# Patient Record
Sex: Male | Born: 1975 | Race: Black or African American | Hispanic: No | Marital: Married | Smoking: Never smoker
Health system: Southern US, Community
[De-identification: ages and names within clinical notes are randomized; demographics above are authoritative.]

## PROBLEM LIST (undated history)

## (undated) DIAGNOSIS — E041 Nontoxic single thyroid nodule: Secondary | ICD-10-CM

## (undated) DIAGNOSIS — IMO0001 Reserved for inherently not codable concepts without codable children: Secondary | ICD-10-CM

## (undated) DIAGNOSIS — E119 Type 2 diabetes mellitus without complications: Secondary | ICD-10-CM

## (undated) DIAGNOSIS — E049 Nontoxic goiter, unspecified: Secondary | ICD-10-CM

## (undated) DIAGNOSIS — G4730 Sleep apnea, unspecified: Secondary | ICD-10-CM

## (undated) DIAGNOSIS — M1990 Unspecified osteoarthritis, unspecified site: Secondary | ICD-10-CM

## (undated) HISTORY — DX: Nontoxic single thyroid nodule: E04.1

## (undated) HISTORY — PX: WISDOM TOOTH EXTRACTION: SHX21

## (undated) HISTORY — DX: Sleep apnea, unspecified: G47.30

## (undated) HISTORY — DX: Type 2 diabetes mellitus without complications: E11.9

## (undated) HISTORY — DX: Nontoxic goiter, unspecified: E04.9

---

## 2004-06-21 HISTORY — PX: VASECTOMY: SHX75

## 2014-10-11 ENCOUNTER — Other Ambulatory Visit: Payer: Self-pay | Admitting: Internal Medicine

## 2014-10-11 ENCOUNTER — Ambulatory Visit
Admission: RE | Admit: 2014-10-11 | Discharge: 2014-10-11 | Disposition: A | Discharge status: Home / Self Care | Payer: Self-pay | Source: Ambulatory Visit | Attending: Internal Medicine | Admitting: Internal Medicine

## 2014-10-11 DIAGNOSIS — R0600 Dyspnea, unspecified: Secondary | ICD-10-CM

## 2014-10-18 ENCOUNTER — Other Ambulatory Visit: Payer: Self-pay | Admitting: Internal Medicine

## 2014-10-18 DIAGNOSIS — R9389 Abnormal findings on diagnostic imaging of other specified body structures: Secondary | ICD-10-CM

## 2014-10-22 ENCOUNTER — Inpatient Hospital Stay: Admission: RE | Admit: 2014-10-22 | Payer: BC Managed Care – PPO | Source: Ambulatory Visit

## 2014-10-22 ENCOUNTER — Telehealth: Payer: Self-pay | Admitting: Internal Medicine

## 2014-10-22 NOTE — Telephone Encounter (Signed)
Received records from [facility] for appointment with Dr Debara Pickett on 11/22/14.  Records given to [facility] (medical records) for Dr Lysbeth Penner schedule on 11/22/14. lp

## 2014-10-25 ENCOUNTER — Ambulatory Visit
Admission: RE | Admit: 2014-10-25 | Discharge: 2014-10-25 | Disposition: A | Discharge status: Home / Self Care | Payer: BC Managed Care – PPO | Source: Ambulatory Visit | Attending: Internal Medicine | Admitting: Internal Medicine

## 2014-10-25 ENCOUNTER — Other Ambulatory Visit: Payer: BC Managed Care – PPO

## 2014-10-25 DIAGNOSIS — R9389 Abnormal findings on diagnostic imaging of other specified body structures: Secondary | ICD-10-CM

## 2014-10-25 MED ORDER — IOPAMIDOL (ISOVUE-300) INJECTION 61%
75.0000 mL | Freq: Once | INTRAVENOUS | Status: AC | PRN
  Administered 2014-10-25: 75 mL via INTRAVENOUS

## 2014-10-31 ENCOUNTER — Other Ambulatory Visit: Payer: Self-pay | Admitting: Internal Medicine

## 2014-10-31 DIAGNOSIS — R9389 Abnormal findings on diagnostic imaging of other specified body structures: Secondary | ICD-10-CM

## 2014-10-31 DIAGNOSIS — E049 Nontoxic goiter, unspecified: Secondary | ICD-10-CM

## 2014-11-01 ENCOUNTER — Ambulatory Visit
Admission: RE | Admit: 2014-11-01 | Discharge: 2014-11-01 | Disposition: A | Discharge status: Home / Self Care | Payer: BC Managed Care – PPO | Source: Ambulatory Visit | Attending: Internal Medicine | Admitting: Internal Medicine

## 2014-11-01 DIAGNOSIS — R9389 Abnormal findings on diagnostic imaging of other specified body structures: Secondary | ICD-10-CM

## 2014-11-01 DIAGNOSIS — E049 Nontoxic goiter, unspecified: Secondary | ICD-10-CM

## 2014-11-11 ENCOUNTER — Other Ambulatory Visit: Payer: Self-pay | Admitting: Internal Medicine

## 2014-11-12 ENCOUNTER — Other Ambulatory Visit: Payer: Self-pay | Admitting: Internal Medicine

## 2014-11-12 DIAGNOSIS — E041 Nontoxic single thyroid nodule: Secondary | ICD-10-CM

## 2014-11-19 ENCOUNTER — Other Ambulatory Visit: Payer: Self-pay | Admitting: Internal Medicine

## 2014-11-19 DIAGNOSIS — E041 Nontoxic single thyroid nodule: Secondary | ICD-10-CM

## 2014-11-20 ENCOUNTER — Ambulatory Visit
Admission: RE | Admit: 2014-11-20 | Discharge: 2014-11-20 | Disposition: A | Discharge status: Home / Self Care | Payer: BC Managed Care – PPO | Source: Ambulatory Visit | Attending: Internal Medicine | Admitting: Internal Medicine

## 2014-11-20 ENCOUNTER — Other Ambulatory Visit (HOSPITAL_COMMUNITY)
Admission: RE | Admit: 2014-11-20 | Discharge: 2014-11-20 | Disposition: A | Discharge status: Home / Self Care | Payer: BC Managed Care – PPO | Source: Ambulatory Visit | Attending: Interventional Radiology | Admitting: Interventional Radiology

## 2014-11-20 DIAGNOSIS — E041 Nontoxic single thyroid nodule: Secondary | ICD-10-CM

## 2014-11-22 ENCOUNTER — Ambulatory Visit (INDEPENDENT_AMBULATORY_CARE_PROVIDER_SITE_OTHER): Payer: BC Managed Care – PPO | Admitting: Internal Medicine

## 2014-11-22 ENCOUNTER — Encounter: Payer: Self-pay | Admitting: Internal Medicine

## 2014-11-22 VITALS — BP 112/88 | HR 68 | Ht 73.0 in | Wt 291.8 lb

## 2014-11-22 DIAGNOSIS — D497 Neoplasm of unspecified behavior of endocrine glands and other parts of nervous system: Secondary | ICD-10-CM

## 2014-11-22 DIAGNOSIS — R0609 Other forms of dyspnea: Secondary | ICD-10-CM | POA: Diagnosis not present

## 2014-11-22 DIAGNOSIS — R0602 Shortness of breath: Secondary | ICD-10-CM

## 2014-11-22 DIAGNOSIS — E0789 Other specified disorders of thyroid: Secondary | ICD-10-CM | POA: Insufficient documentation

## 2014-11-22 DIAGNOSIS — E119 Type 2 diabetes mellitus without complications: Secondary | ICD-10-CM | POA: Insufficient documentation

## 2014-11-22 DIAGNOSIS — Z9989 Dependence on other enabling machines and devices: Secondary | ICD-10-CM

## 2014-11-22 DIAGNOSIS — G4733 Obstructive sleep apnea (adult) (pediatric): Secondary | ICD-10-CM

## 2014-11-22 NOTE — Patient Instructions (Signed)
Dr Debara Pickett has ordered a cardiometabolic test - this is done @ [facility].  Please schedule a follow up visit with Dr. Debara Pickett after your test.   What is a Cardiopulmonary Exercise Test (CPET)?   The Cardiopulmonary Exercise Test is a highly sensitive, non-invasive stress test. It is considered a stress test because the exercise stresses your body's systems by making them work faster and harder. A disease or condition that affects the heart, lungs or muscles will limit how much faster and harder these systems can work. A CPET assesses how well the heart, lungs, and muscles are working individually, and how these systems are working in unison. Your heart and lungs work together to deliver oxygen to your muscles, where it is used to make energy, and to remove carbon dioxide from your body.  The full cardiopulmonary system is assessed during a CPET by measuring the amount of oxygen your body is using, the amount of carbon dioxide it is producing, your breathing pattern, and electrocardiogram (EKG) while you are riding a stationary bicycle.  The traditional treadmill stress test only relies on the EKG, which only partially assesses the heart and nothing else. Besides detecting problems in multiple body systems, the CPET is also used to monitor changes in your disease condition, the effect of certain medications on your body, and if medical therapy is improving your condition.  What conditions can be detected/monitored by the Cardiopulmonary ExerciseTest?  Heart, lung, and metabolic conditions may cause shortness of breath, exercise intolerance or discomfort and pain in the chest. The CPET is the only test that can simultaneously determine which of these systems is causing the problem

## 2014-11-22 NOTE — Progress Notes (Signed)
OFFICE NOTE  Chief Complaint:  DOE  Primary Care Physician: No primary care provider on file.  HPI:  Tommy Rodriguez is a pleasant 39 year old male who is coming referred to me for evaluation of shortness of breath. Recently he's had some shortness of breath with activities particularly at the end of exercise where he has some very heavy breathing. He's also noted to have some wheezing. He does have a history of diabetes, sleep apnea and recently was found to have a thyroid mass. That was recently biopsied and the results are pending. There apparently is some tracheal compression from the mass as seen on imaging. He does wear CPAP at night which seems to help his breathing and is less wheezy. His shortness of breath does tend occur with exertion but he is able to do cycling and exercise without significant difficulty. He denies any chest pain. He does do a lot of physical activity including weight lifting and some aerobic activity. He generally eats a healthy diet.  PMHx:  Past Medical History  Diagnosis Date  . Sleep apnea   . Type 2 diabetes mellitus     Past Surgical History  Procedure Laterality Date  . Wisdom tooth extraction  childhood  . Vasectomy  2006    FAMHx:  Family History  Problem Relation Age of Onset  . Hypertension Father   . Diabetes Father   . Glaucoma Mother   . Kidney failure Mother     s/p transplant  . Stroke Mother   . Hypertension Mother   . Heart failure Mother   . Hypertension Sister   . Diabetes Sister     SOCHx:   reports that he has never smoked. He does not have any smokeless tobacco history on file. His alcohol and drug histories are not on file.  ALLERGIES:  No Known Allergies  ROS: A comprehensive review of systems was negative except for: Ears, nose, mouth, throat, and face: positive for Neck mass Respiratory: positive for dyspnea on exertion  HOME MEDS: Current Outpatient Prescriptions  Medication Sig Dispense Refill  .  glimepiride (AMARYL) 4 MG tablet Take 1 tablet by mouth daily. Take 1 tab daily  11  . metFORMIN (GLUCOPHAGE) 500 MG tablet Take 500 mg by mouth 2 (two) times daily with a meal.    . sitaGLIPtin (JANUVIA) 100 MG tablet Take 100 mg by mouth daily.     No current facility-administered medications for this visit.    LABS/IMAGING: No results found for this or any previous visit (from the past 48 hour(s)). US Thyroid Biopsy  11/21/2014   CLINICAL DATA:  39 year old male  EXAM: ULTRASOUND GUIDED NEEDLE ASPIRATE BIOPSY OF THE THYROID GLAND  COMPARISON:  None.  PROCEDURE: The procedure, risks, benefits, and alternatives were explained to the patient. Questions regarding the procedure were encouraged and answered. The patient understands and consents to the procedure.  Ultrasound survey was performed with images stored and sent to PACs.  The left neck was prepped with Betadine in a sterile fashion, and a sterile drape was applied covering the operative field. A sterile gown and sterile gloves were used for the procedure. Local anesthesia was provided with 1% Lidocaine.  Ultrasound guidance was used to infiltrate the region with 1% lidocaine for local anesthesia. Four separate 25 gauge fine needle biopsy were then acquired of the left upper thyroid, lateral lesion with ultrasound guidance. Images were stored.  Slide preparation was performed.  Final image was stored after biopsy.  Patient tolerated  the procedure well and remained hemodynamically stable throughout.  No complications were encountered and no significant blood loss was encounter  COMPLICATIONS: None.  FINDINGS: Ultrasound survey demonstrates heterogeneous left thyroid lobe. A nodular region in the superior aspect of the left thyroid lobe laterally was targeted for biopsy. This corresponds to the area which was identified on ultrasound survey 11/01/2014.  Images during the case demonstrate safe placement of the needle tip into this laterally position  nodule.  Final image demonstrates no complicating features.  IMPRESSION: Status post ultrasound-guided biopsy of left upper lobe thyroid nodule laterally. Tissue specimen sent to pathology for complete histopathologic analysis.  Ultrasound survey of the left thyroid lobe demonstrates enlarged and heterogeneous thyroid tissue, with no other focal nodule identified on the current study which would be amenable for biopsy.  Signed,  Dulcy Fanny. Earleen Newport, DO  [facility]  [facility]   Electronically Signed   By: Corrie Mckusick D.O.   On: 11/21/2014 08:08   US Thyroid Biopsy  11/21/2014   CLINICAL DATA:  39 year old male  EXAM: ULTRASOUND GUIDED NEEDLE ASPIRATE BIOPSY OF THE THYROID GLAND  COMPARISON:  None.  PROCEDURE: The procedure, risks, benefits, and alternatives were explained to the patient. Questions regarding the procedure were encouraged and answered. The patient understands and consents to the procedure.  Ultrasound survey was performed with images stored and sent to PACs.  The left neck was prepped with Betadine in a sterile fashion, and a sterile drape was applied covering the operative field. A sterile gown and sterile gloves were used for the procedure. Local anesthesia was provided with 1% Lidocaine.  Ultrasound guidance was used to infiltrate the region with 1% lidocaine for local anesthesia. Four separate 25 gauge fine needle biopsy were then acquired of the left upper thyroid, lateral lesion with ultrasound guidance. Images were stored.  Slide preparation was performed.  Final image was stored after biopsy.  Patient tolerated the procedure well and remained hemodynamically stable throughout.  No complications were encountered and no significant blood loss was encounter  COMPLICATIONS: None.  FINDINGS: Ultrasound survey demonstrates heterogeneous left thyroid lobe. A nodular region in the superior aspect of the left thyroid lobe laterally was targeted for  biopsy. This corresponds to the area which was identified on ultrasound survey 11/01/2014.  Images during the case demonstrate safe placement of the needle tip into this laterally position nodule.  Final image demonstrates no complicating features.  IMPRESSION: Status post ultrasound-guided biopsy of left upper lobe thyroid nodule laterally. Tissue specimen sent to pathology for complete histopathologic analysis.  Ultrasound survey of the left thyroid lobe demonstrates enlarged and heterogeneous thyroid tissue, with no other focal nodule identified on the current study which would be amenable for biopsy.  Signed,  Dulcy Fanny. Earleen Newport, DO  [facility]  [facility]   Electronically Signed   By: Corrie Mckusick D.O.   On: 11/21/2014 08:08    WEIGHTS: Wt Readings from Last 3 Encounters:  11/22/14 291 lb 12.8 oz (132.36 kg)    VITALS: BP 112/88 mmHg  Pulse 68  Ht 6\' 1"  (1.854 m)  Wt 291 lb 12.8 oz (132.36 kg)  BMI 38.51 kg/m2  EXAM: General appearance: alert and no distress Neck: no carotid bruit and no JVD Lungs: clear to auscultation bilaterally Heart: regular rate and rhythm, S1, S2 normal, no murmur, click, rub or gallop Abdomen: soft, non-tender; bowel sounds normal; no masses,  no organomegaly Extremities: extremities normal, atraumatic, no cyanosis  or edema Pulses: 2+ and symmetric Skin: Skin color, texture, turgor normal. No rashes or lesions Neurologic: Grossly normal Psych: Pleasant  EKG: Normal sinus rhythm at 68  ASSESSMENT: 1. Dyspnea on exertion 2. Type 2 diabetes 3. Obstructive sleep apnea on CPAP 4. Thyroid mass  PLAN: 1.   Mr. Mcdonagh is describing some shortness of breath and exercise. His EKG is normal at rest. He does have risk factors for heart disease including diabetes, however this is been well controlled only for a few years. Family history significant for heart disease in his mother however she was a type I diabetic.  Given his young age, unlikely that he has any significant coronary disease. Nonetheless, I would recommend an exercise stress test. In fact, cardio metabolic exercise testing on a bicycle would be the most helpful. Would also allow [place] to get some pulmonary function tests to rule out any upper airway obstruction related to his thyroid mass. In addition will monitor EKG and rule out hopefully ischemia.  Plan to see him back to discuss results of the stress test in a few weeks. Thanks again for the kind referral.  Pixie Casino, MD, [facility] Attending Cardiologist [place] 11/22/2014, 11:34 AM

## 2014-12-06 ENCOUNTER — Ambulatory Visit (HOSPITAL_COMMUNITY): Payer: BC Managed Care – PPO | Attending: Internal Medicine

## 2014-12-06 DIAGNOSIS — R0602 Shortness of breath: Secondary | ICD-10-CM

## 2014-12-06 DIAGNOSIS — R0600 Dyspnea, unspecified: Secondary | ICD-10-CM | POA: Insufficient documentation

## 2014-12-31 ENCOUNTER — Ambulatory Visit: Payer: BC Managed Care – PPO | Admitting: Internal Medicine

## 2014-12-31 ENCOUNTER — Telehealth: Payer: Self-pay | Admitting: Internal Medicine

## 2014-12-31 NOTE — Telephone Encounter (Signed)
Returning your call. °

## 2014-12-31 NOTE — Telephone Encounter (Signed)
Spoke with patient regarding CPX results. He will follow up with Dr. Debara Pickett 7/13 0900

## 2015-01-01 ENCOUNTER — Encounter: Payer: Self-pay | Admitting: Internal Medicine

## 2015-01-01 ENCOUNTER — Ambulatory Visit (INDEPENDENT_AMBULATORY_CARE_PROVIDER_SITE_OTHER): Payer: BC Managed Care – PPO | Admitting: Internal Medicine

## 2015-01-01 VITALS — BP 120/80 | HR 78 | Ht 73.0 in | Wt 292.0 lb

## 2015-01-01 DIAGNOSIS — G4733 Obstructive sleep apnea (adult) (pediatric): Secondary | ICD-10-CM | POA: Diagnosis not present

## 2015-01-01 DIAGNOSIS — E119 Type 2 diabetes mellitus without complications: Secondary | ICD-10-CM | POA: Diagnosis not present

## 2015-01-01 DIAGNOSIS — R0609 Other forms of dyspnea: Secondary | ICD-10-CM | POA: Diagnosis not present

## 2015-01-01 DIAGNOSIS — Z9989 Dependence on other enabling machines and devices: Principal | ICD-10-CM

## 2015-01-01 NOTE — Patient Instructions (Signed)
Your physician recommends that you schedule a follow-up appointment as needed  

## 2015-01-01 NOTE — Progress Notes (Signed)
OFFICE NOTE  Chief Complaint:  Follow-up stress test  Primary Care Physician: Kandice Hams, MD  HPI:  Tommy Rodriguez is a pleasant 39 year old male who is coming referred to me for evaluation of shortness of breath. Recently he's had some shortness of breath with activities particularly at the end of exercise where he has some very heavy breathing. He's also noted to have some wheezing. He does have a history of diabetes, sleep apnea and recently was found to have a thyroid mass. That was recently biopsied and the results are pending. There apparently is some tracheal compression from the mass as seen on imaging. He does wear CPAP at night which seems to help his breathing and is less wheezy. His shortness of breath does tend occur with exertion but he is able to do cycling and exercise without significant difficulty. He denies any chest pain. He does do a lot of physical activity including weight lifting and some aerobic activity. He generally eats a healthy diet.  I saw [name] back in the office today for follow-up of his cardio metabolic stress test. The conclusion is as follows:  Conclusion: Exercise testing with gas-exchange demonstrates an excellent functional capacity when compared to matched sedentary norms. There is no evidence of a significant cardiac limitation. At peak exercise, the patient is limited by his ventilation. Resting spirometry revealed a mild restrictive pattern likely related to his obesity. There was no obvious evidence of external airway compression. At peak exercise there was a mild O2 desaturation. Would strongly recommend weight loss and assessment for the need for home O2 (hall walk with pulse oximetry).   Test, report and preliminary impression by: Landis Martins, MS, ACSM-RCEP 12/06/2014 4:23 PM  I have edited the above report with my interpretation.   Bensimhon, Daniel,MD 9:34 PM  I discussed results with him today and feel that there may  be some mild restriction that is limiting his breathing. This is possibly related to weight and deconditioning. I've encouraged him to work on exercise. He's concerned about his thyroid nodule which I understand recently by biopsy was negative. I do not feel that there is an upper airway obstruction causing his restriction. Ultimately may be necessary for him to see a pulmonologist if his symptoms are not improved.  PMHx:  Past Medical History  Diagnosis Date  . Sleep apnea   . Type 2 diabetes mellitus     Past Surgical History  Procedure Laterality Date  . Wisdom tooth extraction  childhood  . Vasectomy  2006    FAMHx:  Family History  Problem Relation Age of Onset  . Hypertension Father   . Diabetes Father   . Glaucoma Mother   . Kidney failure Mother     s/p transplant  . Stroke Mother   . Hypertension Mother   . Heart failure Mother   . Hypertension Sister   . Diabetes Sister     SOCHx:   reports that he has never smoked. He does not have any smokeless tobacco history on file. His alcohol and drug histories are not on file.  ALLERGIES:  No Known Allergies  ROS: A comprehensive review of systems was negative except for: Ears, nose, mouth, throat, and face: positive for Neck mass Respiratory: positive for dyspnea on exertion  HOME MEDS: Current Outpatient Prescriptions  Medication Sig Dispense Refill  . glimepiride (AMARYL) 4 MG tablet Take 1 tablet by mouth daily. Take 1 tab daily  11  . metFORMIN (GLUCOPHAGE) 500 MG tablet  Take 500 mg by mouth 2 (two) times daily with a meal.    . sitaGLIPtin (JANUVIA) 100 MG tablet Take 100 mg by mouth daily.     No current facility-administered medications for this visit.    LABS/IMAGING: No results found for this or any previous visit (from the past 48 hour(s)). No results found.  WEIGHTS: Wt Readings from Last 3 Encounters:  01/01/15 292 lb (132.45 kg)  11/22/14 291 lb 12.8 oz (132.36 kg)    VITALS: BP 120/80 mmHg   Pulse 78  Ht 6\' 1"  (1.854 m)  Wt 292 lb (132.45 kg)  BMI 38.53 kg/m2  EXAM: Deferred  EKG: deferred  ASSESSMENT: 1. Dyspnea on exertion - low risk cardio metabolic exercise test 2. Type 2 diabetes 3. Obstructive sleep apnea on CPAP 4. Thyroid mass  PLAN: 1.   Mr. Harewood had a low risk cardio metabolic exercise test which is most consistent with mild restriction and probably deconditioning due to obesity. He needs to continue work on exercise and some weight loss. Overall feel that this is reassuring and does not indicate any cardiovascular ischemia.  A map to see him back as needed.  Pixie Casino, MD, [facility] Attending Cardiologist [facility] [place] 01/01/2015, 11:00 AM

## 2015-05-26 ENCOUNTER — Encounter: Payer: Self-pay | Admitting: Internal Medicine

## 2015-05-26 ENCOUNTER — Ambulatory Visit (INDEPENDENT_AMBULATORY_CARE_PROVIDER_SITE_OTHER): Payer: BC Managed Care – PPO | Admitting: Internal Medicine

## 2015-05-26 VITALS — BP 140/72 | HR 86 | Ht 73.0 in | Wt 296.4 lb

## 2015-05-26 DIAGNOSIS — D497 Neoplasm of unspecified behavior of endocrine glands and other parts of nervous system: Secondary | ICD-10-CM | POA: Diagnosis not present

## 2015-05-26 DIAGNOSIS — E0789 Other specified disorders of thyroid: Secondary | ICD-10-CM

## 2015-05-26 DIAGNOSIS — R0609 Other forms of dyspnea: Secondary | ICD-10-CM | POA: Diagnosis not present

## 2015-05-26 NOTE — Progress Notes (Signed)
Subjective:    Patient ID: Tommy Rodriguez, male    DOB: Apr 27, 1976,     MRN: [id number]  HPI  16 yobm never smoker form [place]  about 30 lb wt gain since then over  Complicated by dm/ osa  with new onset doe x middle 2015 referred by Dr Delfina Redwood to pulmonary clinic 05/26/2015 with suspected uao from goiter s/p bx 11/20/14 c/w benign thyroid nodule.    05/26/2015 1st [place] [facility] office visit/ [facility]   Chief Complaint  Patient presents with  . Pulmonary Consult    Referred by [facility] Carol for SOB. CT-Chest 11/04/14.    indolent onset minimally progressive doe x one year , only occurs with heavy exertion, never at rest or noct (on cpap though).  No obvious   day to day or daytime variabilty or assoc chronic cough or cp or chest tightness, subjective wheeze overt sinus or hb symptoms. No unusual exp hx or h/o childhood pna/ asthma or knowledge of premature birth.  Sleeping ok without nocturnal  or early am exacerbation  of respiratory  c/o's or need for noct saba. Also denies any obvious fluctuation of symptoms with weather or environmental changes or other aggravating or alleviating factors except as outlined above   Current Medications, Allergies, Complete Past Medical History, Past Surgical History, Family History, and Social History were reviewed in [employer] record.              Review of Systems  Constitutional: Positive for unexpected weight change. Negative for fever, chills, activity change and appetite change.  HENT: Positive for dental problem. Negative for congestion, postnasal drip, rhinorrhea, sneezing, sore throat, trouble swallowing and voice change.   Eyes: Negative for visual disturbance.  Respiratory: Positive for shortness of breath. Negative for cough and choking.        Productive cough since Thyroid issues  Cardiovascular: Negative for chest pain and leg swelling.  Gastrointestinal: Negative for nausea, vomiting and abdominal pain.    Genitourinary: Negative for difficulty urinating.  Musculoskeletal: Positive for arthralgias.  Skin: Negative for rash.  Psychiatric/Behavioral: Negative for behavioral problems and confusion.       Objective:   Physical Exam   Pleasant amb obese bm nad  Wt Readings from Last 3 Encounters:  05/26/15 296 lb 6.4 oz (134.446 kg)  01/01/15 292 lb (132.45 kg)  11/22/14 291 lb 12.8 oz (132.36 kg)    Vital signs reviewed  HEENT: nl dentition, turbinates, and oropharynx. Nl external ear canals without cough reflex   NECK :  without JVD/Nodes / nl carotid upstrokes bilaterally - TM x size of baseball on L / mild pseudowheeze with FVC    LUNGS: no acc muscle use,  Nl contour chest which is clear to A and P bilaterally without cough on insp or exp maneuvers   CV:  RRR  no s3 or murmur or increase in P2, no edema   ABD:  soft and nontender with nl inspiratory excursion in the supine position. 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  This goiter appears to be due to a solitary large left thyroid lobe nodule measuring approximately 12 x 8 cm.        Assessment & Plan:

## 2015-05-26 NOTE — Patient Instructions (Signed)
pfts to be done at [facility] asap and I will call you with the results and also have a surgeon look at your scans

## 2015-05-27 ENCOUNTER — Encounter: Payer: Self-pay | Admitting: Internal Medicine

## 2015-05-27 NOTE — Assessment & Plan Note (Signed)
Agree with Dr Lina Sar last clinic note that likely he has sign airflow obstruction from this and would probably be symptomatic at hs if not for the cpap which may serve to help offset the obstruction  Needs f/v loop and gen surgery consult next   Discussed in detail all the  indications, usual  risks and alternatives  relative to the benefits with patient who agrees to proceed with  w/u as outlined    Total time devoted to counseling  = 35/85m review case including all of his studies/ images/ with pt/ discussion of options/alternatives/ giving and going over instructions (see avs)

## 2015-05-27 NOTE — Assessment & Plan Note (Signed)
Complicated by osa and dm  Body mass index is 39.11 kg/(m^2).  No results found for: TSH   Contributing also to doe/reviewed the need and the process to achieve and maintain neg calorie balance > defer f/u primary care including intermittently monitoring thyroid status

## 2015-05-27 NOTE — Assessment & Plan Note (Signed)
bx 11/20/14 benign c/w goiter

## 2015-05-28 ENCOUNTER — Other Ambulatory Visit: Payer: Self-pay | Admitting: Internal Medicine

## 2015-05-28 ENCOUNTER — Ambulatory Visit (HOSPITAL_COMMUNITY)
Admission: RE | Admit: 2015-05-28 | Discharge: 2015-05-28 | Disposition: A | Discharge status: Home / Self Care | Payer: BC Managed Care – PPO | Source: Ambulatory Visit | Attending: Internal Medicine | Admitting: Internal Medicine

## 2015-05-28 DIAGNOSIS — E0789 Other specified disorders of thyroid: Secondary | ICD-10-CM

## 2015-05-28 DIAGNOSIS — R0609 Other forms of dyspnea: Secondary | ICD-10-CM | POA: Diagnosis present

## 2015-05-28 LAB — PULMONARY FUNCTION TEST
DL/VA % PRED: 128 %
DL/VA: 6.19 ml/min/mmHg/L
DLCO UNC: 23.98 ml/min/mmHg
DLCO unc % pred: 65 %
FEF 25-75 POST: 3.37 L/s
FEF 25-75 PRE: 2.06 L/s
FEF2575-%Change-Post: 63 %
FEF2575-%Pred-Post: 84 %
FEF2575-%Pred-Pre: 51 %
FEV1-%CHANGE-POST: 11 %
FEV1-%PRED-PRE: 61 %
FEV1-%Pred-Post: 68 %
FEV1-PRE: 2.43 L
FEV1-Post: 2.7 L
FEV1FVC-%Change-Post: 2 %
FEV1FVC-%PRED-PRE: 93 %
FEV6-%CHANGE-POST: 5 %
FEV6-%PRED-POST: 70 %
FEV6-%Pred-Pre: 67 %
FEV6-Post: 3.34 L
FEV6-Pre: 3.17 L
FEV6FVC-%CHANGE-POST: 0 %
FEV6FVC-%PRED-POST: 101 %
FEV6FVC-%Pred-Pre: 101 %
FVC-%Change-Post: 8 %
FVC-%PRED-POST: 71 %
FVC-%Pred-Pre: 65 %
FVC-Post: 3.45 L
FVC-Pre: 3.18 L
POST FEV1/FVC RATIO: 78 %
PRE FEV1/FVC RATIO: 77 %
Post FEV6/FVC ratio: 100 %
Pre FEV6/FVC Ratio: 100 %
RV % PRED: 74 %
RV: 1.5 L
TLC % PRED: 66 %
TLC: 5.05 L

## 2015-05-28 MED ORDER — ALBUTEROL SULFATE (2.5 MG/3ML) 0.083% IN NEBU
2.5000 mg | INHALATION_SOLUTION | Freq: Once | RESPIRATORY_TRACT | Status: AC
  Administered 2015-05-28: 2.5 mg via RESPIRATORY_TRACT

## 2015-05-28 NOTE — Progress Notes (Signed)
Quick Note:  Order sent to [facility] ______ 

## 2015-05-29 ENCOUNTER — Other Ambulatory Visit: Payer: Self-pay | Admitting: Internal Medicine

## 2015-05-29 DIAGNOSIS — E0789 Other specified disorders of thyroid: Secondary | ICD-10-CM

## 2015-06-10 ENCOUNTER — Ambulatory Visit (INDEPENDENT_AMBULATORY_CARE_PROVIDER_SITE_OTHER): Payer: BC Managed Care – PPO | Admitting: Endocrinology

## 2015-06-10 ENCOUNTER — Encounter: Payer: Self-pay | Admitting: Endocrinology

## 2015-06-10 VITALS — BP 140/76 | HR 90 | Temp 98.4°F | Ht 73.0 in | Wt 295.0 lb

## 2015-06-10 DIAGNOSIS — E0789 Other specified disorders of thyroid: Secondary | ICD-10-CM

## 2015-06-10 DIAGNOSIS — D497 Neoplasm of unspecified behavior of endocrine glands and other parts of nervous system: Secondary | ICD-10-CM | POA: Diagnosis not present

## 2015-06-10 NOTE — Progress Notes (Signed)
Subjective:    Patient ID: Tommy Rodriguez, male    DOB: 17-Jul-1975, 39 y.o.   MRN: [id number]  HPI Pt states 1 year of slight wheezing in the chest, and assoc weight gain.  he was incidentally noted on CT to have a goiter.  He is unaware of ever having had thyroid problems in the past.  He has no h/o XRT or surgery to the neck.   Past Medical History  Diagnosis Date  . Sleep apnea   . Type 2 diabetes mellitus ([place])   . Thyroid nodule   . Substernal goiter     Past Surgical History  Procedure Laterality Date  . Wisdom tooth extraction  childhood  . Vasectomy  2006    Social History   Social History  . Marital Status: Married    Spouse Name: N/A  . Number of Children: N/A  . Years of Education: N/A   Occupational History  . Educator - Principal    Social History Main Topics  . Smoking status: Never Smoker   . Smokeless tobacco: Not on file  . Alcohol Use: 0.0 oz/week    0 Standard drinks or equivalent per week     Comment: rare  . Drug Use: No  . Sexual Activity: Not on file   Other Topics Concern  . Not on file   Social History Narrative    Current Outpatient Prescriptions on File Prior to Visit  Medication Sig Dispense Refill  . glimepiride (AMARYL) 4 MG tablet Take 1 tablet by mouth daily. Take 1 tab daily  11  . metFORMIN (GLUCOPHAGE) 500 MG tablet Take 500 mg by mouth 2 (two) times daily with a meal.    . sitaGLIPtin (JANUVIA) 100 MG tablet Take 100 mg by mouth daily.     No current facility-administered medications on file prior to visit.    Allergies  Allergen Reactions  . Metformin And Related Diarrhea    Family History  Problem Relation Age of Onset  . Hypertension Father   . Diabetes Father   . Glaucoma Mother   . Kidney failure Mother     s/p transplant  . Stroke Mother   . Hypertension Mother   . Heart failure Mother   . Hypertension Sister   . Diabetes Sister   . Thyroid disease Sister     uncertain type of thyr prob    BP 140/76  mmHg  Pulse 90  Temp(Src) 98.4 F (36.9 C) (Oral)  Ht 6\' 1"  (1.854 m)  Wt 295 lb (133.811 kg)  BMI 38.93 kg/m2  SpO2 96%    Review of Systems Denies fever, hoarseness, neck pain, visual loss, chest pain, skin rash, easy bruising, depression, cold intolerance, headache, numbness, and rhinorrhea.  He has slight cough and solid dysphagia.     Objective:   Physical Exam VS: see vs page GEN: no distress HEAD: head: no deformity eyes: no periorbital swelling, no proptosis external nose and ears are normal.   mouth: no lesion seen.  NECK: multinodular goiter.   CHEST WALL: no deformity.  LUNGS: clear to auscultation.  BREASTS:  No gynecomastia.  CV: reg rate and rhythm, no murmur.   ABD: abdomen is soft, nontender.  no hepatosplenomegaly.  not distended.  no hernia MUSCULOSKELETAL: muscle bulk and strength are grossly normal.  no obvious joint swelling.  gait is normal and steady.   EXTEMITIES: no deformity.  no edema PULSES: no carotid bruit.  NEURO:  cn 2-12 grossly intact.  readily moves all 4's.  sensation is intact to touch on all 4's.   SKIN:  Normal texture and temperature.  No rash or suspicious lesion is visible.   NODES:  None palpable at the neck.  PSYCH: alert, well-oriented.  Does not appear anxious nor depressed.    outside test results are reviewed: TSH=0.92  [place]: Thyromegaly with large left masses, incomplete visualization of substernal extension  i personally reviewed electrocardiogram tracing (05/28/15): Indication: sob. Impression: extrapulmonary airway obstruction.   thyroid cytology: Beth. Cat 2     Assessment & Plan:  Multinodular goiter, new to me, prob benign.   Extrapulmonary airway obstruction.  I told pt this is an indication for surgery.     Patient is advised the following: Patient Instructions  Because of the impact of the thyroid lumps on your breathing, you should consider having surgery.   I would be happy to see you back here as  necessary.

## 2015-06-10 NOTE — Patient Instructions (Addendum)
Because of the impact of the thyroid lumps on your breathing, you should consider having surgery.   I would be happy to see you back here as necessary.

## 2015-06-24 ENCOUNTER — Ambulatory Visit: Payer: Self-pay | Admitting: Surgery

## 2015-09-19 ENCOUNTER — Ambulatory Visit (HOSPITAL_COMMUNITY)
Admission: RE | Admit: 2015-09-19 | Discharge: 2015-09-19 | Disposition: A | Discharge status: Home / Self Care | Payer: BC Managed Care – PPO | Source: Ambulatory Visit | Attending: Anesthesiology | Admitting: Anesthesiology

## 2015-09-19 ENCOUNTER — Encounter (HOSPITAL_COMMUNITY)
Admission: RE | Admit: 2015-09-19 | Discharge: 2015-09-19 | Disposition: A | Discharge status: Home / Self Care | Payer: BC Managed Care – PPO | Source: Ambulatory Visit | Attending: Surgery | Admitting: Surgery

## 2015-09-19 ENCOUNTER — Encounter (HOSPITAL_COMMUNITY): Payer: Self-pay

## 2015-09-19 DIAGNOSIS — Z01818 Encounter for other preprocedural examination: Secondary | ICD-10-CM

## 2015-09-19 DIAGNOSIS — R222 Localized swelling, mass and lump, trunk: Secondary | ICD-10-CM | POA: Insufficient documentation

## 2015-09-19 DIAGNOSIS — I517 Cardiomegaly: Secondary | ICD-10-CM | POA: Insufficient documentation

## 2015-09-19 DIAGNOSIS — J398 Other specified diseases of upper respiratory tract: Secondary | ICD-10-CM | POA: Insufficient documentation

## 2015-09-19 HISTORY — DX: Reserved for inherently not codable concepts without codable children: IMO0001

## 2015-09-19 HISTORY — DX: Unspecified osteoarthritis, unspecified site: M19.90

## 2015-09-19 LAB — BASIC METABOLIC PANEL
Anion gap: 8 (ref 5–15)
BUN: 13 mg/dL (ref 6–20)
CALCIUM: 9.5 mg/dL (ref 8.9–10.3)
CO2: 28 mmol/L (ref 22–32)
Chloride: 106 mmol/L (ref 101–111)
Creatinine, Ser: 1.1 mg/dL (ref 0.61–1.24)
GFR calc Af Amer: >60 mL/min (ref >60)
GLUCOSE: 125 mg/dL — AB (ref 65–99)
Potassium: 4.1 mmol/L (ref 3.5–5.1)
Sodium: 142 mmol/L (ref 135–145)

## 2015-09-19 LAB — CBC
HCT: 41.1 % (ref 39.0–52.0)
Hemoglobin: 13.1 g/dL (ref 13.0–17.0)
MCH: 27.3 pg (ref 26.0–34.0)
MCHC: 31.9 g/dL (ref 30.0–36.0)
MCV: 85.6 fL (ref 78.0–100.0)
PLATELETS: 256 10*3/uL (ref 150–400)
RBC: 4.8 MIL/uL (ref 4.22–5.81)
RDW: 14.9 % (ref 11.5–15.5)
WBC: 4.7 10*3/uL (ref 4.0–10.5)

## 2015-09-19 NOTE — Progress Notes (Signed)
Anesthesia called and saw patient at time of preop appointment.

## 2015-09-19 NOTE — Patient Instructions (Signed)
Tommy Rodriguez  09/19/2015   Your procedure is scheduled on: 09/25/2015    Report to [facility] Main  Entrance take [place]  elevators to 3rd floor to  [place] at    [place] AM.  Call this number if you have problems the morning of surgery [phone]   Remember: ONLY 1 PERSON MAY GO WITH YOU TO SHORT STAY TO GET  READY MORNING OF [place].  Do not eat food or drink liquids :After Midnight.  Eat a good healthy snack prior to bedtime.    Take these medicines the morning of surgery with A SIP OF WATER: Visine eye drops if needed  DO NOT TAKE ANY DIABETIC MEDICATIONS DAY OF YOUR SURGERY                               You may not have any metal on your body including hair pins and              piercings  Do not wear jewelry,  lotions, powders or perfumes, deodorant                         Men may shave face and neck.   Do not bring valuables to the hospital. [place].  Contacts, dentures or bridgework may not be worn into surgery.  Leave suitcase in the car. After surgery it may be brought to your room.         Special Instructions:coughing and deep breathing exercises, leg exercises               Please read over the following fact sheets you were given: _____________________________________________________________________             [facility] - Preparing for Surgery Before surgery, you can play an important role.  Because skin is not sterile, your skin needs to be as free of germs as possible.  You can reduce the number of germs on your skin by washing with CHG (chlorahexidine gluconate) soap before surgery.  CHG is an antiseptic cleaner which kills germs and bonds with the skin to continue killing germs even after washing. Please DO NOT use if you have an allergy to CHG or antibacterial soaps.  If your skin becomes reddened/irritated stop using the CHG and inform your nurse when you arrive at Short  Stay. Do not shave (including legs and underarms) for at least 48 hours prior to the first CHG shower.  You may shave your face/neck. Please follow these instructions carefully:  1.  Shower with CHG Soap the night before surgery and the  morning of Surgery.  2.  If you choose to wash your hair, wash your hair first as usual with your  normal  shampoo.  3.  After you shampoo, rinse your hair and body thoroughly to remove the  shampoo.                           4.  Use CHG as you would any other liquid soap.  You can apply chg directly  to the skin and wash  Gently with a scrungie or clean washcloth.  5.  Apply the CHG Soap to your body ONLY FROM THE NECK DOWN.   Do not use on face/ open                           Wound or open sores. Avoid contact with eyes, ears mouth and genitals (private parts).                       Wash face,  Genitals (private parts) with your normal soap.             6.  Wash thoroughly, paying special attention to the area where your surgery  will be performed.  7.  Thoroughly rinse your body with warm water from the neck down.  8.  DO NOT shower/wash with your normal soap after using and rinsing off  the CHG Soap.                9.  Pat yourself dry with a clean towel.            10.  Wear clean pajamas.            11.  Place clean sheets on your bed the night of your first shower and do not  sleep with pets. Day of Surgery : Do not apply any lotions/deodorants the morning of surgery.  Please wear clean clothes to the hospital/surgery center.  FAILURE TO FOLLOW THESE INSTRUCTIONS MAY RESULT IN THE CANCELLATION OF YOUR SURGERY PATIENT SIGNATURE_________________________________  NURSE SIGNATURE__________________________________  ________________________________________________________________________

## 2015-09-20 LAB — HEMOGLOBIN A1C
Hgb A1c MFr Bld: 6.8 % — ABNORMAL HIGH (ref 4.8–5.6)
Mean Plasma Glucose: 148 mg/dL

## 2015-09-22 NOTE — Progress Notes (Signed)
EKG-11/22/14- EPIC 12/06/14- Stress Test- 12/06/14- EPIC  01/01/15- LOV- Cardiology- EPIC  05/26/15- LOv- Pulmonary- EPIC

## 2015-09-23 ENCOUNTER — Encounter: Payer: Self-pay | Admitting: Cardiothoracic Surgery

## 2015-09-23 ENCOUNTER — Institutional Professional Consult (permissible substitution) (INDEPENDENT_AMBULATORY_CARE_PROVIDER_SITE_OTHER): Payer: BC Managed Care – PPO | Admitting: Cardiothoracic Surgery

## 2015-09-23 VITALS — BP 142/93 | HR 75 | Resp 16 | Ht 73.0 in | Wt 285.0 lb

## 2015-09-23 DIAGNOSIS — J9859 Other diseases of mediastinum, not elsewhere classified: Secondary | ICD-10-CM

## 2015-09-23 DIAGNOSIS — J398 Other specified diseases of upper respiratory tract: Secondary | ICD-10-CM

## 2015-09-23 NOTE — Progress Notes (Signed)
[address]       [place] [address]             [phone]                    Rafiq Genrich [place] [facility] Medical Record [id number] Date of Birth: 06-28-1975  Referring: Armandina Gemma, MD Primary Care: Kandice Hams, MD  Chief Complaint:    Chief Complaint  Patient presents with  . Referral    for surgical assist per Dr. Harlow Asa...CT CHEST 10/25/14    History of Present Illness:    Taos Lannen 40 y.o. male is seen in the office  today For evaluation of substernal mass. Patient notes a slow progression of increasing shortness breath over the past several years. Initially with exertion, now with exertion he gets some wheezing. He is able to lay down flat, uses C Pap at night to sleep. He was seen by Dr. Harlow Asa and scheduled for left thyroid lobectomy, with substernal thyroid. At the suggestion of anesthesia he came to the office to be seen preoperatively by thoracic surgery in addition.   Current Activity/ Functional Status:  Patient is independent with mobility/ambulation, transfers, ADL's, IADL's.   Zubrod Score: At the time of surgery this patient's most appropriate activity status/level should be described as: []     0    Normal activity, no symptoms [x]     1    Restricted in physical strenuous activity but ambulatory, able to do out light work []     2    Ambulatory and capable of self care, unable to do work activities, up and about               >50 % of waking hours                              []     3    Only limited self care, in bed greater than 50% of waking hours []     4    Completely disabled, no self care, confined to bed or chair []     5    Moribund   Past Medical History  Diagnosis Date  . Type 2 diabetes mellitus ([place])   . Thyroid nodule   . Substernal goiter   . Sleep apnea     cpap- does not know settings   . Shortness of breath dyspnea     because of enlarged thyroid   . Arthritis     left knee     Past Surgical History    Procedure Laterality Date  . Wisdom tooth extraction  childhood  . Vasectomy  2006    Family History  Problem Relation Age of Onset  . Hypertension Father   . Diabetes Father   . Glaucoma Mother   . Kidney failure Mother     s/p transplant  . Stroke Mother   . Hypertension Mother   . Heart failure Mother   . Hypertension Sister   . Diabetes Sister   . Thyroid disease Sister     uncertain type of thyr prob    Social History   Social History  . Marital Status: Married    Spouse Name: N/A  . Number of Children: N/A  . Years of Education: N/A   Occupational History  . Educator - Principal    Social History Main Topics  . Smoking status: Never Smoker   .  Smokeless tobacco: Never Used  . Alcohol Use: 0.0 oz/week    0 Standard drinks or equivalent per week     Comment: rare  . Drug Use: No  . Sexual Activity: Not on file   Other Topics Concern  . Not on file   Social History Narrative    History  Smoking status  . Never Smoker   Smokeless tobacco  . Never Used    History  Alcohol Use  . 0.0 oz/week  . 0 Standard drinks or equivalent per week    Comment: rare     Allergies  Allergen Reactions  . Metformin And Related Diarrhea    Current Outpatient Prescriptions  Medication Sig Dispense Refill  . glimepiride (AMARYL) 4 MG tablet Take 4 mg by mouth daily.   11  . ibuprofen (ADVIL,MOTRIN) 200 MG tablet Take 400 mg by mouth every 6 (six) hours as needed (Pain).     . metFORMIN (GLUCOPHAGE) 500 MG tablet Take 500 mg by mouth 2 (two) times daily with a meal.    . sitaGLIPtin (JANUVIA) 100 MG tablet Take 100 mg by mouth daily.    Marland Kitchen tetrahydrozoline (VISINE) 0.05 % ophthalmic solution Place 1 drop into both eyes 2 (two) times daily as needed (Eye irritation).      No current facility-administered medications for this visit.      Review of Systems:     Cardiac Review of Systems: Y or N  Chest Pain [  n  ]  Resting SOB [ n  ] Exertional SOB  Blue.Reese  ]   Orthopnea Florencio.Farrier  ]   Pedal Edema [  n ]    Palpitations [ n ] Syncope  [n  ]   Presyncope [  n ]  General Review of Systems: [Y] = yes [  ]=no Constitional: recent weight change [ n ];  Wt loss over the last 3 months [   ] anorexia [  ]; fatigue [  ]; nausea [  ]; night sweats [  ]; fever [  ]; or chills [  ];          Dental: poor dentition[  ]; Last Dentist visit:   Eye : blurred vision [  ]; diplopia [   ]; vision changes [  ];  Amaurosis fugax[  ]; Resp: cough [  ];  wheezing[  ];  hemoptysis[  ]; shortness of breath[  ]; paroxysmal nocturnal dyspnea[  ]; dyspnea on exertion[  ]; or orthopnea[  ];  GI:  gallstones[  ], vomiting[  ];  dysphagia[  ]; melena[  ];  hematochezia [  ]; heartburn[  ];   Hx of  Colonoscopy[  ]; GU: kidney stones [  ]; hematuria[  ];   dysuria [  ];  nocturia[  ];  history of     obstruction [  ]; urinary frequency [ n ]             Skin: rash, swelling[  ];, hair loss[  ];  peripheral edema[  ];  or itching[  ]; Musculosketetal: myalgias[  ];  joint swelling[  ];  joint erythema[  ];  joint pain[  ];  back pain[  ];  Heme/Lymph: bruising[  ];  bleeding[  ];  anemia[ n ];  Neuro: TIA[ n];  headaches[  ];  stroke[ n ];  vertigo[  ];  seizures[  ];   paresthesias[  ];  difficulty walking[  ];  Psych:depression[  ];  anxiety[  ];  Endocrine: diabetes[ n ];  thyroid dysfunction[ y ];  Immunizations: Flu up to date [  ]; Pneumococcal up to date [  ];  Other:  Physical Exam: BP 142/93 mmHg  Pulse 75  Resp 16  Ht 6\' 1"  (1.854 m)  Wt 285 lb (129.275 kg)  BMI 37.61 kg/m2  SpO2 98%  PHYSICAL EXAMINATION: General appearance: alert, cooperative and no distress Head: Normocephalic, without obvious abnormality, atraumatic Neck: Greater than size 22 collar,  left thyroid is easily palpable, no carotid bruit, no JVD, supple, symmetrical, trachea deviated to the right and left lobe thyroid is enlarged, symmetric, no tenderness/mass/nodules Lymph nodes: Cervical,  supraclavicular, and axillary nodes normal. Resp: clear to auscultation bilaterally Back: symmetric, no curvature. ROM normal. No CVA tenderness. Cardio: regular rate and rhythm, S1, S2 normal, no murmur, click, rub or gallop GI: soft, non-tender; bowel sounds normal; no masses,  no organomegaly Extremities: extremities normal, atraumatic, no cyanosis or edema Neurologic: Grossly normal  Diagnostic Studies & Laboratory data:     Recent Radiology Findings:   Dg Chest 2 View  09/19/2015  CLINICAL DATA:  Preop examination thyroid surgery. EXAM: CHEST  2 VIEW COMPARISON:  None. FINDINGS: Mild cardiomegaly. No confluent airspace opacities or effusions. Deviation of the trachea to the right due to mediastinal mass, likely substernal thyroid goiter. No acute bony abnormality. IMPRESSION: Mild cardiomegaly. Left mediastinal mass with deviation of the trachea to the right, likely substernal thyroid goiter. Electronically Signed   By: Rolm Baptise M.D.   On: 09/19/2015 09:37  CLINICAL DATA: Dyspnea on exertion. Mediastinal mass with tracheal deviation on chest radiograph.  EXAM: CT CHEST WITH CONTRAST  TECHNIQUE: Multidetector CT imaging of the chest was performed during intravenous contrast administration.  CONTRAST: 75 mL Isovue 300  COMPARISON: Chest radiograph on 10/11/2014  FINDINGS: Mediastinum/Lymph Nodes: Substernal goiter is demonstrated causing tracheal deviation to the right, which corresponds with the mediastinal mass seen on chest radiograph. This appears to represent a solitary large left thyroid lobe nodule measuring approximately 12 x 8 cm.  No lymphadenopathy identified within the thorax. Great vessels another mediastinal structures are unremarkable in appearance.  Lungs/Pleura: No pulmonary infiltrate or mass identified. No effusion present.  Musculoskeletal/Soft Tissues: No suspicious bone lesions or other significant chest wall abnormality.  Upper  Abdomen: Unremarkable.  IMPRESSION: Substernal goiter causing tracheal deviation to the right, corresponding to mediastinal mass seen on chest radiograph.  This goiter appears to be due to a solitary large left thyroid lobe nodule measuring approximately 12 x 8 cm. Thyroid carcinoma cannot be excluded. Thyroid ultrasound is recommended for further evaluation. This follows ACR consensus guidelines: Managing Incidental Thyroid Nodules Detected on Imaging: White Paper of the ACR Incidental Thyroid Findings Committee. J Am Coll Radiol 2015; 12:143-150.   Electronically Signed  By: Earle Gell M.D.  On: 10/25/2014 15:28   I have independently reviewed the above radiologic studies.  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 I discussed the case with Dr. Harlow Asa, and  agree with proceeding with left thyroid lobectomy with removal of the substernal portion, it's likely that there will be no need for sternotomy to resect the substernal portion but I will offer assistance to Dr. Harlow Asa as needed. I discussed with the patient the potential airway  Issues with his trachea deviated. Also the nerve/ recurrent laryngeal and endocrine risks of thyroidectomy . We will tentatively plan surgery April 6.   I  spent 30 minutes counseling the patient face to face and 50% or more the  time was spent in counseling and coordination of care. The total time spent in the appointment was 40 minutes.  Grace Isaac MD      [address] [place] [address] Office [phone]   Beeper  [phone]  09/23/2015 4:20 PM

## 2015-09-24 ENCOUNTER — Encounter (HOSPITAL_COMMUNITY): Payer: Self-pay | Admitting: Surgery

## 2015-09-24 ENCOUNTER — Other Ambulatory Visit: Payer: Self-pay | Admitting: *Deleted

## 2015-09-24 ENCOUNTER — Other Ambulatory Visit: Payer: Self-pay

## 2015-09-24 DIAGNOSIS — E049 Nontoxic goiter, unspecified: Secondary | ICD-10-CM | POA: Diagnosis present

## 2015-09-24 NOTE — Progress Notes (Signed)
Anesthesia Chart Review: Patient is a 40 year old male scheduled for left thyroid lobectomy, possible sternotomy on 09/25/15 by Dr. Harlow Asa and Dr. Servando Snare. PAT was initially done at [facility] but case moved to [facility] (heart room) due to substernal goiter and thus a possibility for need for sternotomy for left thyroid lobectomy.  History includes non-smoker, DM2, thyroid nodule (benign biopsy 11/20/14) with substernal goiter, OSA with CPAP, arthritis, obesity (BMI 37.6).  PCP is Dr. Seward Carol. Endocrinologist is Dr. Loanne Drilling. He was seen by cardiologist Dr. Debara Pickett last year for evaluation of SOB. He had a low risk cardio metabolic exercise test most consistent with mild restriction and probably deconditioning due to obesity. PRN cardiology follow-up recommended. He was also seen by pulmonologist Dr. Melvyn Novas in 05/2015. He was referred to general surgery for DOE associated with his larger substernal goiter.   Meds include Amaryl, metformin, Januvia.   11/22/14 EKG: NSR.  12/06/14 Cardiopulmonary Exercise Test: Conclusion: Exercise testing with gas-exchange demonstrates an excellent functional capacity when compared to matched sedentary norms. There is no evidence of a significant cardiac limitation. At peak exercise, the patient is limited by his ventilation. Resting spirometry revealed a mild restrictive pattern likely related to his obesity. There was no obvious evidence of external airway compression. At peak exercise there was a mild O2 desaturation. Would strongly recommend weight loss and assessment for the need for home O2 (hall walk with pulse oximetry).   09/19/15 CXR: IMPRESSION: Mild cardiomegaly. Left mediastinal mass with deviation of the trachea to the right, likely substernal thyroid goiter.  10/25/14 Chest CT w/ contrast: IMPRESSION: Substernal goiter causing tracheal deviation to the right, corresponding to mediastinal mass seen on chest radiograph. This goiter appears to be due to a  solitary large left thyroid lobe nodule measuring approximately 12 x 8 cm. Thyroid carcinoma cannot be excluded. Thyroid ultrasound is recommended for further evaluation.  11/01/14 Thyroid U/S: 1. Thyromegaly with large left masses (measured at least 89 x 53 x 69 mm. 32 x 22 x 27 mm solid nodule, superior pole. Adjacent 31 x 25 x 35 mm nodule), incomplete visualization of sub sternal extension.  05/28/15 PFTs: FVC 3.18 (65%), FEV1 2.43 (61%), DLCO 23.98 (65%).  Preoperative labs noted. A1c 6.8. Since sternotomy is a possibility, I added PT/PTT and T&S to be done on arrival.   [facility] Short Stay Center/Anesthesiology Phone [phone] 09/24/2015 1:30 PM

## 2015-09-24 NOTE — H&P (Signed)
General Surgery [facility]  Tommy Rodriguez DOB: 09-29-1975 Married / Language: English / Race: Black or African American Male  History of Present Illness The patient is a 40 year old male who presents with a complaint of Enlarged thyroid.  Patient is referred by Dr. Seward Carol for evaluation of asymmetric thyroid goiter with compressive symptoms. Patient first noted approximately one year ago the development of shortness of breath with physical exercise. This became progressively worse. Patient underwent a chest x-ray which demonstrated deviation of the trachea. Subsequent CT scan in May 2016 showed an enlarged mass occupying the majority of the left thyroid lobe measuring over 12 cm in greatest dimension and extending into the upper mediastinum. It had an ultrasound performed on Nov 01, 2014. This showed a normal right thyroid lobe. Left lobe was markedly enlarged and contained at least 2 solid nodules measuring over 3 cm in size. Patient underwent fine-needle aspiration biopsy of one of the thyroid nodules with benign cytopathology. Patient was seen in consultation by Dr. Renato Shin from endocrinology and by Dr. Christinia Gully from pulmonary medicine. Patient has had no prior head or neck surgery. He does have sleep apnea and uses CPAP. Patient has never been on thyroid medication. There is a family history of thyroid disease and the patient's sisters but details are not known. There is no family history of thyroid cancer or other endocrine neoplasm.  Other Problems Arthritis Diabetes Mellitus Sleep Apnea Thyroid Disease  Past Surgical History Vasectomy  Diagnostic Studies History Colonoscopy never  Allergies No Known Drug Allergies01/08/2015  Medication History MetFORMIN HCl (500MG  Tablet, Oral) Active. Glimepiride (4MG  Tablet, Oral) Active. Januvia (100MG  Tablet, Oral) Active. Medications Reconciled  Review of Systems General  Present- Fatigue, Night Sweats and Weight Gain. Not Present- Appetite Loss, Chills, Fever and Weight Loss. Skin Present- Dryness. Not Present- Change in Wart/Mole, Hives, Jaundice, New Lesions, Non-Healing Wounds, Rash and Ulcer. HEENT Present- Visual Disturbances and Wears glasses/contact lenses. Not Present- Earache, Hearing Loss, Hoarseness, Nose Bleed, Oral Ulcers, Ringing in the Ears, Seasonal Allergies, Sinus Pain, Sore Throat and Yellow Eyes. Respiratory Present- Snoring and Wheezing. Not Present- Bloody sputum, Chronic Cough and Difficulty Breathing. Breast Not Present- Breast Mass, Breast Pain, Nipple Discharge and Skin Changes. Cardiovascular Present- Difficulty Breathing Lying Down, Shortness of Breath and Swelling of Extremities. Not Present- Chest Pain, Leg Cramps, Palpitations and Rapid Heart Rate. Gastrointestinal Present- Difficulty Swallowing. Not Present- Abdominal Pain, Bloating, Bloody Stool, Change in Bowel Habits, Chronic diarrhea, Constipation, Excessive gas, Gets full quickly at meals, Hemorrhoids, Indigestion, Nausea, Rectal Pain and Vomiting. Male Genitourinary Not Present- Blood in Urine, Change in Urinary Stream, Frequency, Impotence, Nocturia, Painful Urination, Urgency and Urine Leakage. Psychiatric Present- Change in Sleep Pattern. Not Present- Anxiety, Bipolar, Depression, Fearful and Frequent crying. Endocrine Present- Hair Changes and New Diabetes. Not Present- Cold Intolerance, Excessive Hunger, Heat Intolerance and Hot flashes.  Vitals Weight: 299 lb Height: 74in Body Surface Area: 2.58 m Body Mass Index: 38.39 kg/m  Temp.: 98.12F(Temporal)  Pulse: 83 (Regular)  BP: 130/70 (Sitting, Left Arm, Standard)  Physical Exam  General - appears comfortable, no distress; not diaphorectic  HEENT - normocephalic; sclerae clear, gaze conjugate; mucous membranes moist, dentition good; voice normal  Neck - asymmetric on extension; no palpable anterior or  posterior cervical adenopathy; no palpable masses in the right thyroid bed; obvious deviation of the larynx and trachea to the right by approximately 1-2 cm; left thyroid lobe was markedly enlarged and multinodular extending  approximately 8-9 cm from apex to the left clavicle and then descending beneath the clavicle into the mediastinum for an unknown distance  Chest - clear bilaterally without rhonchi, rales, or wheeze  Cor - regular rhythm with normal rate; no significant murmur  Ext - non-tender without significant edema or lymphedema  Neuro - grossly intact; no tremor  Assessment & Plan  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 There is a moderately large substernal component to the left thyroid lobe. Patient will likely require left thyroid lobectomy for management of compressive symptoms.  Patient is provided with written literature on thyroid surgery to review at home. He is accompanied today by his wife.  I recommended left thyroid lobectomy. We have discussed the risk and benefits of the procedure including the potential for recurrent laryngeal nerve injury and injury to parathyroid glands. We have discussed the potential need for completion thyroidectomy in the event of malignancy. We have discussed the location of the surgical incision, the hospital stay to be anticipated, and his postoperative recovery and return to work. We have discussed the potential need for lifelong thyroid hormone supplementation. Patient understands and wishes to proceed with surgery sometime later this spring.  Patient has been evaluated by Dr. Ceasar Mons from thoracic surgery.  He will assist and be available should we need to open the chest / mediastinum.  Case will be moved to [facility] and scheduled in a thoracic / cardiac room as a precaution.  The risks and benefits of the procedure have  been discussed at length with the patient. The patient understands the proposed procedure, potential alternative treatments, and the course of recovery to be expected. All of the patient's questions have been answered at this time. The patient wishes to proceed with surgery.  Earnstine Regal, MD, [facility] Office: [phone]

## 2015-09-24 NOTE — Progress Notes (Signed)
Pt denies cardiac history or chest pain. States he has sob on exertion which they think the goiter is causing.Pt is diabetic, states that he stopped his diabetic medications 3 weeks ago "in preparation for this surgery". He states his fasting blood sugar has been running between 94-100.

## 2015-09-25 ENCOUNTER — Encounter (HOSPITAL_COMMUNITY)
Admission: RE | Disposition: A | Discharge status: Home / Self Care | Payer: Self-pay | Source: Ambulatory Visit | Attending: Surgery

## 2015-09-25 ENCOUNTER — Ambulatory Visit (HOSPITAL_COMMUNITY): Payer: BC Managed Care – PPO | Admitting: Anesthesiology

## 2015-09-25 ENCOUNTER — Observation Stay (HOSPITAL_COMMUNITY)
Admission: RE | Admit: 2015-09-25 | Discharge: 2015-09-26 | Disposition: A | Discharge status: Home / Self Care | Payer: BC Managed Care – PPO | Source: Ambulatory Visit | Attending: Surgery | Admitting: Surgery

## 2015-09-25 ENCOUNTER — Encounter (HOSPITAL_COMMUNITY): Payer: Self-pay | Admitting: *Deleted

## 2015-09-25 DIAGNOSIS — G4733 Obstructive sleep apnea (adult) (pediatric): Secondary | ICD-10-CM | POA: Diagnosis not present

## 2015-09-25 DIAGNOSIS — E669 Obesity, unspecified: Secondary | ICD-10-CM | POA: Insufficient documentation

## 2015-09-25 DIAGNOSIS — E049 Nontoxic goiter, unspecified: Secondary | ICD-10-CM

## 2015-09-25 DIAGNOSIS — J398 Other specified diseases of upper respiratory tract: Secondary | ICD-10-CM | POA: Insufficient documentation

## 2015-09-25 DIAGNOSIS — E042 Nontoxic multinodular goiter: Principal | ICD-10-CM | POA: Insufficient documentation

## 2015-09-25 DIAGNOSIS — Z7984 Long term (current) use of oral hypoglycemic drugs: Secondary | ICD-10-CM | POA: Diagnosis not present

## 2015-09-25 DIAGNOSIS — Z6837 Body mass index (BMI) 37.0-37.9, adult: Secondary | ICD-10-CM | POA: Insufficient documentation

## 2015-09-25 DIAGNOSIS — E119 Type 2 diabetes mellitus without complications: Secondary | ICD-10-CM | POA: Insufficient documentation

## 2015-09-25 DIAGNOSIS — E0789 Other specified disorders of thyroid: Secondary | ICD-10-CM | POA: Diagnosis present

## 2015-09-25 HISTORY — PX: THYROID LOBECTOMY: SHX420

## 2015-09-25 LAB — GLUCOSE, CAPILLARY
GLUCOSE-CAPILLARY: 128 mg/dL — AB (ref 65–99)
Glucose-Capillary: 115 mg/dL — ABNORMAL HIGH (ref 65–99)
Glucose-Capillary: 140 mg/dL — ABNORMAL HIGH (ref 65–99)
Glucose-Capillary: 112 mg/dL — ABNORMAL HIGH (ref 65–99)

## 2015-09-25 LAB — PROTIME-INR
INR: 1.02 (ref 0.00–1.49)
PROTHROMBIN TIME: 13.6 s (ref 11.6–15.2)

## 2015-09-25 LAB — SURGICAL PCR SCREEN
MRSA, PCR: NEGATIVE
STAPHYLOCOCCUS AUREUS: NEGATIVE

## 2015-09-25 LAB — ABO/RH: ABO/RH(D): AB POS

## 2015-09-25 LAB — APTT: APTT: 35 s (ref 24–37)

## 2015-09-25 LAB — PREPARE RBC (CROSSMATCH)

## 2015-09-25 SURGERY — LOBECTOMY, THYROID
Anesthesia: General | Site: Neck

## 2015-09-25 MED ORDER — KCL IN DEXTROSE-NACL 20-5-0.45 MEQ/L-%-% IV SOLN
INTRAVENOUS | Status: AC
  Administered 2015-09-25: 50 mL/h via INTRAVENOUS
  Filled 2015-09-25: qty 1000

## 2015-09-25 MED ORDER — HYDROMORPHONE HCL 1 MG/ML IJ SOLN
INTRAMUSCULAR | Status: AC
  Administered 2015-09-25: 0.5 mg via INTRAVENOUS
  Filled 2015-09-25: qty 1

## 2015-09-25 MED ORDER — ONDANSETRON 4 MG PO TBDP: 4.0000 mg | ORAL_TABLET | Freq: Four times a day (QID) | ORAL | Status: DC | PRN

## 2015-09-25 MED ORDER — SUCCINYLCHOLINE CHLORIDE 20 MG/ML IJ SOLN
INTRAMUSCULAR | Status: AC
  Filled 2015-09-25: qty 1

## 2015-09-25 MED ORDER — INSULIN ASPART 100 UNIT/ML ~~LOC~~ SOLN
0.0000 [IU] | Freq: Three times a day (TID) | SUBCUTANEOUS | Status: DC
  Administered 2015-09-25: 2 [IU] via SUBCUTANEOUS

## 2015-09-25 MED ORDER — LIDOCAINE HCL 4 % EX SOLN
CUTANEOUS | Status: DC | PRN
  Administered 2015-09-25: 3 mL via TOPICAL

## 2015-09-25 MED ORDER — HYDROMORPHONE HCL 1 MG/ML IJ SOLN
0.2500 mg | INTRAMUSCULAR | Status: DC | PRN
  Administered 2015-09-25: 0.5 mg via INTRAVENOUS

## 2015-09-25 MED ORDER — KETAMINE HCL 10 MG/ML IJ SOLN
INTRAMUSCULAR | Status: DC | PRN
  Administered 2015-09-25: 20 mg via INTRAVENOUS
  Administered 2015-09-25: 10 mg via INTRAVENOUS
  Administered 2015-09-25: 20 mg via INTRAVENOUS

## 2015-09-25 MED ORDER — ARTIFICIAL TEARS OP OINT
TOPICAL_OINTMENT | OPHTHALMIC | Status: AC
  Filled 2015-09-25: qty 3.5

## 2015-09-25 MED ORDER — ACETAMINOPHEN 650 MG RE SUPP: 650.0000 mg | Freq: Four times a day (QID) | RECTAL | Status: DC | PRN

## 2015-09-25 MED ORDER — KETAMINE HCL 100 MG/ML IJ SOLN
INTRAMUSCULAR | Status: AC
  Filled 2015-09-25: qty 1

## 2015-09-25 MED ORDER — GLYCOPYRROLATE 0.2 MG/ML IJ SOLN
INTRAMUSCULAR | Status: DC | PRN
  Administered 2015-09-25: .2 mg via INTRAVENOUS

## 2015-09-25 MED ORDER — MIDAZOLAM HCL 5 MG/5ML IJ SOLN
INTRAMUSCULAR | Status: DC | PRN
  Administered 2015-09-25: 2 mg via INTRAVENOUS

## 2015-09-25 MED ORDER — ONDANSETRON HCL 4 MG/2ML IJ SOLN
INTRAMUSCULAR | Status: DC | PRN
  Administered 2015-09-25: 4 mg via INTRAVENOUS

## 2015-09-25 MED ORDER — HEMOSTATIC AGENTS (NO CHARGE) OPTIME
TOPICAL | Status: DC | PRN
  Administered 2015-09-25: 1 via TOPICAL

## 2015-09-25 MED ORDER — SUGAMMADEX SODIUM 500 MG/5ML IV SOLN
INTRAVENOUS | Status: DC | PRN
  Administered 2015-09-25: 258.6 mg via INTRAVENOUS

## 2015-09-25 MED ORDER — SODIUM CHLORIDE 0.9 % IJ SOLN
INTRAMUSCULAR | Status: AC
  Filled 2015-09-25: qty 10

## 2015-09-25 MED ORDER — LACTATED RINGERS IV SOLN
INTRAVENOUS | Status: DC | PRN
  Administered 2015-09-25 (×2): via INTRAVENOUS

## 2015-09-25 MED ORDER — MIDAZOLAM HCL 2 MG/2ML IJ SOLN
INTRAMUSCULAR | Status: AC
  Filled 2015-09-25: qty 2

## 2015-09-25 MED ORDER — HYDROMORPHONE HCL 1 MG/ML IJ SOLN
INTRAMUSCULAR | Status: AC
  Administered 2015-09-25: 1 mg
  Filled 2015-09-25: qty 1

## 2015-09-25 MED ORDER — SUGAMMADEX SODIUM 500 MG/5ML IV SOLN
INTRAVENOUS | Status: AC
  Filled 2015-09-25: qty 5

## 2015-09-25 MED ORDER — ROCURONIUM BROMIDE 50 MG/5ML IV SOLN
INTRAVENOUS | Status: AC
  Filled 2015-09-25: qty 1

## 2015-09-25 MED ORDER — PHENYLEPHRINE HCL 10 MG/ML IJ SOLN
10.0000 mg | INTRAVENOUS | Status: DC | PRN
  Administered 2015-09-25: 25 ug/min via INTRAVENOUS

## 2015-09-25 MED ORDER — CEFAZOLIN SODIUM 1-5 GM-% IV SOLN
INTRAVENOUS | Status: AC
  Filled 2015-09-25: qty 50

## 2015-09-25 MED ORDER — LACTATED RINGERS IV SOLN
INTRAVENOUS | Status: DC | PRN
  Administered 2015-09-25: 07:00:00 via INTRAVENOUS

## 2015-09-25 MED ORDER — MEPERIDINE HCL 25 MG/ML IJ SOLN: 6.2500 mg | INTRAMUSCULAR | Status: DC | PRN

## 2015-09-25 MED ORDER — KCL IN DEXTROSE-NACL 20-5-0.45 MEQ/L-%-% IV SOLN
INTRAVENOUS | Status: DC
  Administered 2015-09-25: 50 mL/h via INTRAVENOUS

## 2015-09-25 MED ORDER — CEFAZOLIN SODIUM-DEXTROSE 2-4 GM/100ML-% IV SOLN
INTRAVENOUS | Status: AC
  Filled 2015-09-25: qty 100

## 2015-09-25 MED ORDER — DEXTROSE 5 % IV SOLN
3.0000 g | INTRAVENOUS | Status: AC
  Administered 2015-09-25: 3 g via INTRAVENOUS
  Filled 2015-09-25: qty 3000

## 2015-09-25 MED ORDER — SODIUM CHLORIDE 0.9 % IV SOLN: Freq: Once | INTRAVENOUS | Status: DC

## 2015-09-25 MED ORDER — EPHEDRINE SULFATE 50 MG/ML IJ SOLN
INTRAMUSCULAR | Status: AC
  Filled 2015-09-25: qty 1

## 2015-09-25 MED ORDER — HYDROMORPHONE HCL 1 MG/ML IJ SOLN
1.0000 mg | INTRAMUSCULAR | Status: DC | PRN
  Administered 2015-09-25: 2 mg via INTRAVENOUS
  Administered 2015-09-25 – 2015-09-26 (×2): 1 mg via INTRAVENOUS
  Filled 2015-09-25: qty 2
  Filled 2015-09-25 (×2): qty 1

## 2015-09-25 MED ORDER — PROPOFOL 10 MG/ML IV BOLUS
INTRAVENOUS | Status: AC
  Filled 2015-09-25: qty 40

## 2015-09-25 MED ORDER — ONDANSETRON HCL 4 MG/2ML IJ SOLN
4.0000 mg | Freq: Four times a day (QID) | INTRAMUSCULAR | Status: DC | PRN
  Administered 2015-09-25: 4 mg via INTRAVENOUS
  Filled 2015-09-25: qty 2

## 2015-09-25 MED ORDER — OXYCODONE HCL 5 MG PO TABS
5.0000 mg | ORAL_TABLET | ORAL | Status: DC | PRN
  Administered 2015-09-26: 10 mg via ORAL
  Filled 2015-09-25: qty 2

## 2015-09-25 MED ORDER — FENTANYL CITRATE (PF) 100 MCG/2ML IJ SOLN
INTRAMUSCULAR | Status: DC | PRN
  Administered 2015-09-25: 100 ug via INTRAVENOUS

## 2015-09-25 MED ORDER — ACETAMINOPHEN 325 MG PO TABS
650.0000 mg | ORAL_TABLET | Freq: Four times a day (QID) | ORAL | Status: DC | PRN
Start: 2015-09-25 — End: 2015-09-26
  Administered 2015-09-26: 650 mg via ORAL
  Filled 2015-09-25: qty 2

## 2015-09-25 MED ORDER — 0.9 % SODIUM CHLORIDE (POUR BTL) OPTIME
TOPICAL | Status: DC | PRN
  Administered 2015-09-25: 2000 mL

## 2015-09-25 MED ORDER — MUPIROCIN 2 % EX OINT
1.0000 "application " | TOPICAL_OINTMENT | Freq: Once | CUTANEOUS | Status: AC
  Administered 2015-09-25: 1 via TOPICAL
  Filled 2015-09-25: qty 22

## 2015-09-25 MED ORDER — LIDOCAINE HCL (CARDIAC) 20 MG/ML IV SOLN
INTRAVENOUS | Status: DC | PRN
  Administered 2015-09-25: 120 mg via INTRAVENOUS

## 2015-09-25 MED ORDER — FENTANYL CITRATE (PF) 250 MCG/5ML IJ SOLN
INTRAMUSCULAR | Status: AC
  Filled 2015-09-25: qty 5

## 2015-09-25 MED ORDER — ROCURONIUM BROMIDE 100 MG/10ML IV SOLN
INTRAVENOUS | Status: DC | PRN
  Administered 2015-09-25 (×2): 5 mg via INTRAVENOUS
  Administered 2015-09-25 (×2): 10 mg via INTRAVENOUS
  Administered 2015-09-25: 50 mg via INTRAVENOUS
  Administered 2015-09-25: 10 mg via INTRAVENOUS

## 2015-09-25 MED ORDER — LIDOCAINE HCL (CARDIAC) 20 MG/ML IV SOLN
INTRAVENOUS | Status: AC
  Filled 2015-09-25: qty 5

## 2015-09-25 SURGICAL SUPPLY — 73 items
ATTRACTOMAT 16X20 MAGNETIC DRP (DRAPES) ×4 IMPLANT
BENZOIN TINCTURE PRP APPL 2/3 (GAUZE/BANDAGES/DRESSINGS) ×4 IMPLANT
BLADE CORE FAN STRYKER (BLADE) ×4 IMPLANT
BLADE LONG MED 31MMX9MM (MISCELLANEOUS) ×1
BLADE LONG MED 31X9 (MISCELLANEOUS) ×3 IMPLANT
BLADE SAW  SAG 19.5X90X1.27 HD (BLADE) ×4 IMPLANT
BLADE SURG 10 STRL SS (BLADE) ×4 IMPLANT
BLADE SURG 15 STRL LF DISP TIS (BLADE) ×2 IMPLANT
BLADE SURG 15 STRL SS (BLADE) ×2
BLADE SURG ROTATE 9660 (MISCELLANEOUS) IMPLANT
CANISTER SUCTION 2500CC (MISCELLANEOUS) ×4 IMPLANT
CHLORAPREP W/TINT 10.5 ML (MISCELLANEOUS) IMPLANT
CLIP TI MEDIUM 24 (CLIP) ×4 IMPLANT
CLIP TI WIDE RED SMALL 24 (CLIP) ×4 IMPLANT
CLOSURE WOUND 1/2 X4 (GAUZE/BANDAGES/DRESSINGS) ×1
CONT SPEC 4OZ CLIKSEAL STRL BL (MISCELLANEOUS) IMPLANT
COVER SURGICAL LIGHT HANDLE (MISCELLANEOUS) ×4 IMPLANT
CRADLE DONUT ADULT HEAD (MISCELLANEOUS) ×4 IMPLANT
DERMABOND ADVANCED (GAUZE/BANDAGES/DRESSINGS) ×2
DERMABOND ADVANCED .7 DNX12 (GAUZE/BANDAGES/DRESSINGS) ×2 IMPLANT
DRAIN HEMOVAC 7FR (DRAIN) ×4 IMPLANT
DRAIN JACKSON RD 7FR 3/32 (WOUND CARE) IMPLANT
DRAPE LAPAROTOMY T 98X78 PEDS (DRAPES) ×4 IMPLANT
DRAPE UTILITY XL STRL (DRAPES) ×4 IMPLANT
DRSG AQUACEL AG ADV 3.5X14 (GAUZE/BANDAGES/DRESSINGS) ×4 IMPLANT
ELECT BLADE 4.0 EZ CLEAN MEGAD (MISCELLANEOUS) ×4
ELECT CAUTERY BLADE 6.4 (BLADE) ×4 IMPLANT
ELECT REM PT RETURN 9FT ADLT (ELECTROSURGICAL) ×4
ELECTRODE BLDE 4.0 EZ CLN MEGD (MISCELLANEOUS) ×2 IMPLANT
ELECTRODE REM PT RTRN 9FT ADLT (ELECTROSURGICAL) ×2 IMPLANT
EVACUATOR SILICONE 100CC (DRAIN) ×4 IMPLANT
GAUZE SPONGE 4X4 12PLY STRL (GAUZE/BANDAGES/DRESSINGS) ×4 IMPLANT
GAUZE SPONGE 4X4 16PLY XRAY LF (GAUZE/BANDAGES/DRESSINGS) ×12 IMPLANT
GLOVE SURG ORTHO 8.0 STRL STRW (GLOVE) ×4 IMPLANT
GOWN STRL REUS W/ TWL LRG LVL3 (GOWN DISPOSABLE) ×6 IMPLANT
GOWN STRL REUS W/ TWL XL LVL3 (GOWN DISPOSABLE) ×2 IMPLANT
GOWN STRL REUS W/TWL LRG LVL3 (GOWN DISPOSABLE) ×6
GOWN STRL REUS W/TWL XL LVL3 (GOWN DISPOSABLE) ×2
HEMOSTAT SURGICEL 2X4 FIBR (HEMOSTASIS) ×8 IMPLANT
KIT BASIN OR (CUSTOM PROCEDURE TRAY) ×4 IMPLANT
KIT ROOM TURNOVER OR (KITS) ×4 IMPLANT
MARKER SKIN DUAL TIP RULER LAB (MISCELLANEOUS) ×4 IMPLANT
NS IRRIG 1000ML POUR BTL (IV SOLUTION) ×4 IMPLANT
PACK SURGICAL SETUP 50X90 (CUSTOM PROCEDURE TRAY) ×4 IMPLANT
PAD ARMBOARD 7.5X6 YLW CONV (MISCELLANEOUS) ×4 IMPLANT
PENCIL BUTTON HOLSTER BLD 10FT (ELECTRODE) ×4 IMPLANT
SHEARS HARMONIC 9CM CVD (BLADE) ×4 IMPLANT
SPECIMEN JAR MEDIUM (MISCELLANEOUS) IMPLANT
SPONGE GAUZE 4X4 12PLY STER LF (GAUZE/BANDAGES/DRESSINGS) ×4 IMPLANT
SPONGE INTESTINAL PEANUT (DISPOSABLE) ×4 IMPLANT
STRIP CLOSURE SKIN 1/2X4 (GAUZE/BANDAGES/DRESSINGS) ×3 IMPLANT
SUT ETHILON 3 0 FSL (SUTURE) ×4 IMPLANT
SUT MNCRL AB 4-0 PS2 18 (SUTURE) ×8 IMPLANT
SUT PROLENE 4 0 RB 1 (SUTURE) ×2
SUT PROLENE 4-0 RB1 .5 CRCL 36 (SUTURE) ×2 IMPLANT
SUT SILK 2 0 (SUTURE) ×2
SUT SILK 2 0 SH CR/8 (SUTURE) ×4 IMPLANT
SUT SILK 2-0 18XBRD TIE 12 (SUTURE) ×2 IMPLANT
SUT SILK 4 0 (SUTURE) ×2
SUT SILK 4-0 18XBRD TIE 12 (SUTURE) ×2 IMPLANT
SUT VIC AB 1 CTX 36 (SUTURE) ×2
SUT VIC AB 1 CTX36XBRD ANBCTR (SUTURE) ×2 IMPLANT
SUT VIC AB 2-0 CTX 27 (SUTURE) ×4 IMPLANT
SUT VIC AB 3-0 SH 18 (SUTURE) ×4 IMPLANT
SUT VIC AB 3-0 SH 8-18 (SUTURE) ×8 IMPLANT
SUT VIC AB 3-0 X1 27 (SUTURE) ×4 IMPLANT
SYR BULB 3OZ (MISCELLANEOUS) IMPLANT
TAPE CLOTH SURG 4X10 WHT LF (GAUZE/BANDAGES/DRESSINGS) ×4 IMPLANT
TOWEL OR 17X24 6PK STRL BLUE (TOWEL DISPOSABLE) ×4 IMPLANT
TOWEL OR 17X26 10 PK STRL BLUE (TOWEL DISPOSABLE) ×4 IMPLANT
TRAY FOLEY IC TEMP SENS 16FR (CATHETERS) ×4 IMPLANT
TUBE CONNECTING 12'X1/4 (SUCTIONS) ×1
TUBE CONNECTING 12X1/4 (SUCTIONS) ×3 IMPLANT

## 2015-09-25 NOTE — Anesthesia Procedure Notes (Addendum)
Procedure Name: Intubation Date/Time: 09/25/2015 8:09 AM Performed by: Suzy Bouchard Pre-anesthesia Checklist: Patient identified, Timeout performed, Emergency Drugs available, Suction available and Patient being monitored Patient Re-evaluated:Patient Re-evaluated prior to inductionOxygen Delivery Method: Circle system utilized Preoxygenation: Pre-oxygenation with 100% oxygen Intubation Type: Combination inhalational/ intravenous induction Ventilation: Mask ventilation without difficulty Laryngoscope Size: Glidescope and 3 Grade View: Grade II Tube type: Oral Endobronchial tube: Left and 39 Fr Number of attempts: 1 Airway Equipment and Method: Video-laryngoscopy,  Stylet,  LTA kit utilized and Fiberoptic brochoscope Placement Confirmation: ETT inserted through vocal cords under direct vision,  breath sounds checked- equal and bilateral and CO2 detector Tube secured with: Tape Dental Injury: Teeth and Oropharynx as per pre-operative assessment  Difficulty Due To: Difficulty was anticipated Comments: Inhalation induction with sevo/ IV ketamine/ Versed.  Patient kept spontaneously breathing, assisted after induction.  Able to move air.  Look with glidescope, good view.  LTA.  Patient then masked with sevoflurane x1 minute.  Look with glidescope and 30f Left EBT inserted through cords under direct vision.  Confirmed position with flexible bronchocsope.  HOB 30 degrees throughout induction and intubation.

## 2015-09-25 NOTE — Progress Notes (Signed)
      [address]       [address],[place] [address]             [phone]                 Day of Surgery Procedure(s) (LRB):  LEFT THYROID LOBECTOMY (Left)    Subjective: Stable in 6n  Objective: Vital signs in last 24 hours: Patient Vitals for the past 24 hrs:  BP Temp Temp src Pulse Resp SpO2 Height Weight  09/25/15 1245 115/67 mmHg 98.3 F (36.8 C) Oral 82 18 98 % - -  09/25/15 1210 120/69 mmHg 98.7 F (37.1 C) - 71 17 99 % - -  09/25/15 1155 122/73 mmHg - - 63 15 99 % - -  09/25/15 1140 118/77 mmHg - - 65 17 99 % - -  09/25/15 1125 116/81 mmHg - - 62 (!) 22 100 % - -  09/25/15 1110 119/74 mmHg - - 64 16 100 % - -  09/25/15 1055 113/79 mmHg 98.5 F (36.9 C) - 74 12 97 % - -  09/25/15 0643 (!) 144/88 mmHg 98.3 F (36.8 C) Oral 70 18 99 % 6\' 1"  (1.854 m) 285 lb (129.275 kg)    Filed Weights   09/25/15 0643  Weight: 285 lb (129.275 kg)    Hemodynamic parameters for last 24 hours:    Intake/Output from previous day:   Intake/Output this shift: Total I/O In: 1879.2 [I.V.:1879.2] Out: 1725 [Urine:1230; Drains:95; Blood:400]  Scheduled Meds: . ceFAZolin      . ceFAZolin      . insulin aspart  0-15 Units Subcutaneous TID WC   Continuous Infusions: . dextrose 5 % and 0.45 % NaCl with KCl 20 mEq/L 50 mL/hr at 09/25/15 1200   PRN Meds:.acetaminophen **OR** acetaminophen, HYDROmorphone (DILAUDID) injection, ondansetron **OR** ondansetron (ZOFRAN) IV, oxyCODONE    Lab Results: CBC:No results for input(s): WBC, HGB, HCT, PLT in the last 72 hours. BMET: No results for input(s): NA, K, CL, CO2, GLUCOSE, BUN, CREATININE, CALCIUM in the last 72 hours.  PT/INR:  Recent Labs  09/25/15 0634  LABPROT 13.6  INR 1.02     Radiology No results found.   Assessment/Plan: S/P Procedure(s) (LRB):  LEFT THYROID LOBECTOMY (Left) Stable post op, no respiratory difficulty Voice good    Grace Isaac MD 09/25/2015 6:42 PM

## 2015-09-25 NOTE — Interval H&P Note (Signed)
History and Physical Interval Note:  09/25/2015 7:10 AM  Tommy Rodriguez  has presented today for surgery, with the diagnosis of left thyroid goiter with compressive symptoms.  The various methods of treatment have been discussed with the patient and family. After consideration of risks, benefits and other options for treatment, the patient has consented to    Procedure(s):  LEFT THYROID LOBECTOMY (Left) Possible STERNOTOMY (N/A) as a surgical intervention .    The patient's history has been reviewed, patient examined, no change in status, stable for surgery.  I have reviewed the patient's chart and labs.  Questions were answered to the patient's satisfaction.    Earnstine Regal, MD, [facility] Office: [place]

## 2015-09-25 NOTE — Transfer of Care (Signed)
Immediate Anesthesia Transfer of Care Note  Patient: Tommy Rodriguez  Procedure(s) Performed: Procedure(s):  LEFT THYROID LOBECTOMY (Left)  Patient Location: PACU  Anesthesia Type:General  Level of Consciousness: sedated  Airway & Oxygen Therapy: Patient Spontanous Breathing and Patient connected to nasal cannula oxygen  Post-op Assessment: Report given to RN and Post -op Vital signs reviewed and stable  Post vital signs: Reviewed and stable  Last Vitals:  Filed Vitals:   09/25/15 0643  BP: 144/88  Pulse: 70  Temp: 36.8 C  Resp: 18    Complications: No apparent anesthesia complications

## 2015-09-25 NOTE — Anesthesia Postprocedure Evaluation (Signed)
Anesthesia Post Note  Patient: Tommy Rodriguez  Procedure(s) Performed: Procedure(s) (LRB):  LEFT THYROID LOBECTOMY (Left)  Patient location during evaluation: PACU Anesthesia Type: General Level of consciousness: sedated and patient cooperative Pain management: pain level controlled Vital Signs Assessment: post-procedure vital signs reviewed and stable Respiratory status: spontaneous breathing Cardiovascular status: stable Anesthetic complications: no    Last Vitals:  Filed Vitals:   09/25/15 1210 09/25/15 1245  BP: 120/69 115/67  Pulse: 71 82  Temp: 37.1 C 36.8 C  Resp: 17 18    Last Pain:  Filed Vitals:   09/25/15 1512  PainSc: [place]

## 2015-09-25 NOTE — Brief Op Note (Signed)
09/25/2015  10:52 AM  PATIENT:  Tommy Rodriguez  40 y.o. male  PRE-OPERATIVE DIAGNOSIS:  left thyroid goiter with compressive symptoms  POST-OPERATIVE DIAGNOSIS:  substernal thyroid goiter   PROCEDURE:  Left thyroid lobectomy with resection of substernal thyroid goiter  SURGEON:  Surgeon(s) and Role:    * Armandina Gemma, MD - Primary    * Grace Isaac, MD - Assisting  ANESTHESIA:   general  EBL:  Total I/O In: 1000 [I.V.:1000] Out: 960 [Urine:560; Blood:400]  BLOOD ADMINISTERED:none  DRAINS: (7Fr) Jackson-Pratt drain(s) with closed bulb suction in the left thyroid bed   LOCAL MEDICATIONS USED:  NONE  SPECIMEN:  Excision  DISPOSITION OF SPECIMEN:  PATHOLOGY  COUNTS:  YES  TOURNIQUET:  * No tourniquets in log *  DICTATION: .Other Dictation: Dictation Number [id number]  PLAN OF CARE: Admit for overnight observation  PATIENT DISPOSITION:  PACU - hemodynamically stable.   Delay start of Pharmacological VTE agent (>24hrs) due to surgical blood loss or risk of bleeding: yes  Earnstine Regal, MD, [facility] Office: [phone]

## 2015-09-25 NOTE — Anesthesia Preprocedure Evaluation (Addendum)
Anesthesia Evaluation  Patient identified by MRN, date of birth, ID band Patient awake    Reviewed: Allergy & Precautions, NPO status , Patient's Chart, lab work & pertinent test results  Airway Mallampati: II  TM Distance: >3 FB Neck ROM: Full    Dental no notable dental hx. (+) Teeth Intact, Dental Advisory Given   Pulmonary shortness of breath, sleep apnea ,    Pulmonary exam normal breath sounds clear to auscultation       Cardiovascular + DOE  Normal cardiovascular exam Rhythm:Regular Rate:Normal     Neuro/Psych negative neurological ROS  negative psych ROS   GI/Hepatic negative GI ROS, Neg liver ROS,   Endo/Other  negative endocrine ROSdiabetes  Renal/GU negative Renal ROS     Musculoskeletal  (+) Arthritis ,   Abdominal   Peds  Hematology negative hematology ROS (+)   Anesthesia Other Findings   Reproductive/Obstetrics                            Anesthesia Physical Anesthesia Plan  ASA: III  Anesthesia Plan: General   Post-op Pain Management:    Induction: Intravenous  Airway Management Planned: Oral ETT  Additional Equipment:   Intra-op Plan:   Post-operative Plan: Extubation in OR  Informed Consent: I have reviewed the patients History and Physical, chart, labs and discussed the procedure including the risks, benefits and alternatives for the proposed anesthesia with the patient or authorized representative who has indicated his/her understanding and acceptance.   Dental advisory given  Plan Discussed with: CRNA  Anesthesia Plan Comments:         Anesthesia Quick Evaluation

## 2015-09-26 ENCOUNTER — Encounter (HOSPITAL_COMMUNITY): Payer: Self-pay | Admitting: Surgery

## 2015-09-26 DIAGNOSIS — E042 Nontoxic multinodular goiter: Secondary | ICD-10-CM | POA: Diagnosis not present

## 2015-09-26 LAB — GLUCOSE, CAPILLARY: Glucose-Capillary: 108 mg/dL — ABNORMAL HIGH (ref 65–99)

## 2015-09-26 MED ORDER — OXYCODONE HCL 5 MG PO TABS: 5.0000 mg | ORAL_TABLET | ORAL | Status: DC | PRN

## 2015-09-26 NOTE — Discharge Summary (Signed)
Physician Discharge Summary [facility]  Patient ID: Tommy Rodriguez MRN: [id number] DOB/AGE: Oct 19, 1975 40 y.o.  Admit date: 09/25/2015 Discharge date: 09/26/2015  Admission Diagnoses:  Substernal thyroid goiter  Discharge Diagnoses:  Principal Problem:   Substernal thyroid goiter Active Problems:   Thyroid mass of unclear etiology   Discharged Condition: good  Hospital Course: Patient was admitted for observation following thyroid surgery.  Post op course was uncomplicated.  Pain was well controlled.  Tolerated diet.  Patient was prepared for discharge home on POD#1.  Consults:  Cardiothoracic surgery, Dr. Ceasar Mons  Treatments: surgery: left thyroid lobectomy with resection of substernal goiter  Discharge Exam: Blood pressure 112/61, pulse 74, temperature 98.1 F (36.7 C), temperature source Oral, resp. rate 19, height 6\' 1"  (1.854 m), weight 129.275 kg (285 lb), SpO2 99 %. HEENT - clear Neck - wound dry and intact; mild STS; voice normal; minimal from drain - removed Chest - clear bilaterally Cor - RRR  Disposition: Home  Discharge Instructions    Diet - low sodium heart healthy    Complete by:  As directed      Discharge instructions    Complete by:  As directed   [facility]  THYROID & PARATHYROID SURGERY:  POST-OP INSTRUCTIONS  Always review your discharge instruction sheet from the facility where your surgery was performed.  A prescription for pain medication may be given to you upon discharge.  Take your pain medication as prescribed.  If narcotic pain medicine is not needed, then you may take acetaminophen (Tylenol) or ibuprofen (Advil) as needed.  Take your usually prescribed medications unless otherwise directed.  If you need a refill on your pain medication, please contact your pharmacy. They will contact our office to request authorization.  Prescriptions will not be processed by our office after 5 pm or on  weekends.  Start with a light diet upon arrival home, such as soup and crackers or toast.  Be sure to drink plenty of fluids daily.  Resume your normal diet the day after surgery.  Most patients will experience some swelling and bruising on the chest and neck area.  Ice packs will help.  Swelling and bruising can take several days to resolve.   It is common to experience some constipation after surgery.  Increasing fluid intake and taking a stool softener will usually help or prevent this problem.  A mild laxative (Milk of Magnesia or Miralax) should be taken according to package directions if there has been no bowel movement after 48 hours.  You have steri-strips and a gauze dressing over your incision.  You may remove the gauze bandage on the second day after surgery, and you may shower at that time.  Leave your steri-strips (small skin tapes) in place directly over the incision.  These strips should remain on the skin for 5-7 days and then be removed.  You may get them wet in the shower and pat them dry.  You may resume regular (light) daily activities beginning the next day - such as daily self-care, walking, climbing stairs - gradually increasing activities as tolerated.  You may have sexual intercourse when it is comfortable.  Refrain from any heavy lifting or straining until approved by your doctor.  You may drive when you no longer are taking prescription pain medication, you can comfortably wear a seatbelt, and you can safely maneuver your car and apply brakes.  You should see your doctor in the office for a follow-up  appointment approximately two to three weeks after your surgery.  Make sure that you call for this appointment within a day or two after you arrive home to insure a convenient appointment time.  WHEN TO CALL YOUR DOCTOR: -- Fever greater than 101.5 -- Inability to urinate -- Nausea and/or vomiting - persistent -- Extreme swelling or bruising -- Continued bleeding from  incision -- Increased pain, redness, or drainage from the incision -- Difficulty swallowing or breathing -- Muscle cramping or spasms -- Numbness or tingling in hands or around lips  The clinic staff is available to answer your questions during regular business hours.  Please don't hesitate to call and ask to speak to one of the nurses if you have concerns.  Earnstine Regal, MD, [facility] Office: [phone]  Website: [email or web address]     Ice pack    Complete by:  As directed      Increase activity slowly    Complete by:  As directed      Remove dressing in 24 hours    Complete by:  As directed   Remove gauze dressing in 24 hours.  Begin showers tomorrow.  Leave Dermabond 7-10 days.            Medication List    TAKE these medications        glimepiride 4 MG tablet  Commonly known as:  AMARYL  Take 4 mg by mouth daily.     ibuprofen 200 MG tablet  Commonly known as:  ADVIL,MOTRIN  Take 400 mg by mouth every 6 (six) hours as needed (Pain).     metFORMIN 500 MG tablet  Commonly known as:  GLUCOPHAGE  Take 500 mg by mouth 2 (two) times daily with a meal.     oxyCODONE 5 MG immediate release tablet  Commonly known as:  Oxy IR/ROXICODONE  Take 1-2 tablets (5-10 mg total) by mouth every 4 (four) hours as needed for moderate pain.     sitaGLIPtin 100 MG tablet  Commonly known as:  JANUVIA  Take 100 mg by mouth daily.     VISINE 0.05 % ophthalmic solution  Generic drug:  tetrahydrozoline  Place 1 drop into both eyes 2 (two) times daily as needed (Eye irritation).           Follow-up Information    Follow up with Earnstine Regal, MD. Schedule an appointment as soon as possible for a visit in 3 weeks.   Specialty:  General Surgery   Why:  For wound re-check   Contact information:   [place] [address] [phone]       Earnstine Regal, MD, [facility] Office: [phone]   Signed: Earnstine Regal 09/26/2015, 9:49 AM

## 2015-09-26 NOTE — Op Note (Signed)
NAMEDIOR, PALOMAREZ NO.:  [id number]  MEDICAL RECORD NO.:  [id number]  LOCATION:  6N31C                        FACILITY:  [facility]  PHYSICIAN:  Earnstine Regal, MD      DATE OF BIRTH:  [birth year]  DATE OF PROCEDURE:  09/25/2015                              OPERATIVE REPORT   PREOPERATIVE DIAGNOSIS:  Substernal thyroid goiter with tracheal deviation.  POSTOPERATIVE DIAGNOSIS:  Substernal thyroid goiter with tracheal deviation.  PROCEDURE:  Left thyroid lobectomy with resection of large substernal goiter, cervical approach.  SURGEON:  Earnstine Regal, M.D.  ASSISTANT:  Lanelle Bal, M.D.  ANESTHESIA:  General per Dr. Daiva Nakayama.  ESTIMATED BLOOD LOSS:  400 mL.  PREPARATION:  Betadine.  COMPLICATIONS:  None.  INDICATIONS:  The patient is a 39 year old male referred by his primary care physician, Dr. Seward Carol, for asymmetric thyroid goiter with compressive symptoms.  The patient had developed shortness of breath approximately 1 year ago.  This was exacerbated by physical exercise. Chest x-ray was obtained which showed substantial deviation of the trachea.  CT scan in May 2016 showed an enlarged mass occupying the majority of the left lobe of the thyroid measuring up to 12 cm in greatest dimension.  The right thyroid lobe appeared normal.  The patient underwent fine needle aspiration biopsy with benign cytopathology.  The patient was seen in consultation by Dr. Renato Shin from Endocrinology and Dr. Christinia Gully from Pulmonary Medicine.  The patient was referred for thyroid resection for asymmetric thyroid substernal goiter with compressive symptoms.  BODY OF REPORT:  Procedure was done in OR #14 at the [facility].  The patient was brought to the operating room, placed in supine position on the operating room table.  Following administration of general endotracheal anesthesia, the patient was positioned and then prepped and  draped in the usual aseptic fashion. After ascertaining that an adequate level of anesthesia had been achieved, a Kocher incision was made with a #15 blade.  Dissection was carried through subcutaneous tissues and platysma.  Hemostasis was achieved with the electrocautery.  Skin flaps were elevated cephalad and caudad from the thyroid notch to the sternal notch.  A Mahorner self- retaining retractor was placed for exposure.  Strap muscles were incised in the midline avoiding very large external jugular veins bilaterally. Dissection was carried down to the thyroid.  The right lobe of the thyroid was normal on palpation.  Trachea and larynx were markedly deviated to the right.  Strap muscles were elevated and reflected off the left thyroid lobe.  The dissection was difficult due to the size of the mass and tension on the surrounding tissues.  Venous tributaries were identified and divided between Ligaclips with the Harmonic scalpel. Gland was gently mobilized with blunt dissection.  The superior pole vessels were taken down between medium Ligaclips and divided with the Harmonic scalpel.  Anatomy was markedly distorted due to the size of the mass.  Dissection was carried below the clavicle with gentle blunt dissection using digital dissection, Kittners, and the Army-Navy retractor. Harmonic Scalpel was used to divide tissue allowing for complete mobilization of the left thyroid lobe.  With  some difficulty, the lobe was mobilized and delivered out of the mediastinum using an Army-Navy type retractor to elevate the deepest part of the substernal goiter.  The entire lobe was delivered up and into the wound.  It was rolled medially allowing for takedown of venous tributaries between medium Ligaclips with the Harmonic scalpel.  Dissection was carried down to the isthmus which was then transected with the electrocautery and good hemostasis noted.  The entire left lobe was excised.  It was  submitted to pathology in its entirety for review.  Neck was inspected and good hemostasis was achieved throughout the operative field.  A 7-mm Jackson-Pratt drain was brought in from a lateral stab wound and placed into the mediastinal cavity.  Fibrillar was placed throughout the operative field.  The drain was secured to the skin with 3-0 nylon suture.  Strap muscles were reapproximated in the midline with interrupted 3-0 Vicryl sutures.  Platysma was closed with interrupted 3-0 Vicryl sutures.  Skin was closed with a running 4-0 Monocryl subcuticular suture.  Dermabond was placed on the incision. The patient was awakened from anesthesia and brought to the recovery room.  The patient tolerated the procedure well.   Earnstine Regal, MD, [facility] Office: [phone]   TMG/MEDQ  D:  09/25/2015  T:  09/26/2015  Job:  [id number]  cc:   Grace Isaac Dr. Gala Lewandowsky office

## 2015-09-26 NOTE — Progress Notes (Signed)
Discharge hOme. Home discharge instruction given, no question verbalized.

## 2015-09-27 LAB — TYPE AND SCREEN
ABO/RH(D): AB POS
ANTIBODY SCREEN: NEGATIVE
UNIT DIVISION: 0
Unit division: 0

## 2015-09-29 NOTE — Progress Notes (Signed)
Quick Note:  Please contact patient and notify of benign pathology results.  Drayke Grabel M. Nyasiah Moffet, MD, FACS [facility] Office: [phone]   ______ 

## 2016-05-24 DIAGNOSIS — M1712 Unilateral primary osteoarthritis, left knee: Secondary | ICD-10-CM | POA: Diagnosis present

## 2016-05-24 NOTE — H&P (Signed)
PREOPERATIVE H&P Patient ID: Tommy Rodriguez MRN: [id number] DOB/AGE: Nov 04, 1975 40 y.o.  Chief Complaint: OA left knee and tear left medial meniscus  Planned Procedure Date: 06/10/16 Medical and Cardiac Clearance by Dr. Delfina Redwood    HPI: Dhaval Radcliff is a 41 y.o. male with a history of diet/exercise controlled DM, thyroid goiter s/p hemithyroidectomy, and OSA on CPAP who presents for evaluation of OA left knee and tear left medial meniscus. The patient has a history of pain and functional disability in the left  knee due to arthritis and meniscal tear and has failed non-surgical conservative treatments for greater than 12 weeks to include NSAID's and/or analgesics, corticosteriod injections, bracing, and activity modification.  Onset of symptoms was gradual, starting 5 + years ago with gradually worsening course since that time. Patient currently rates pain at 7 out of 10 with activity. Patient has worsening of pain with activity and weight bearing and pain that interferes with activities of daily living.  Patient has evidence of Medial meniscal tear with prominent degenerative medial chondral thinning by MRI. Medial joint space narrowing with preserved lateral space and good medial opening on stress views on plain xray. There is no active knee infection.  He does have one loose tooth w/o fever drainage or overt signs of infection - he will be seeing a dentist before surgery and will report findings after this visit.  Past Medical History:  Diagnosis Date  . Arthritis    left knee   . Shortness of breath dyspnea    because of enlarged thyroid   . Sleep apnea    cpap- does not know settings   . Substernal goiter   . Thyroid nodule   . Thyroid nodule   . Type 2 diabetes mellitus [facility])    Past Surgical History: Vasectomy Partial thyroidectomy April 2017.  NKDA  Medications: Denies current medication use.  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  3-4 etoh drinks per  month.  Family History  Problem Relation Age of Onset  . Hypertension Father   . Diabetes Father   . Glaucoma Mother   . Kidney failure Mother     s/p transplant  . Stroke Mother   . Hypertension Mother   . Heart failure Mother   . Hypertension Sister   . Diabetes Sister   . Thyroid disease Sister     uncertain type of thyr prob    ROS: Currently denies lightheadedness, dizziness, Fever, chills, CP, SOB.   No personal history of DVT, PE, MI, or CVA. No dentures.  One bottom left loose tooth. All other systems have been reviewed and were otherwise currently negative with the exception of those mentioned in the HPI and as above.  Objective: Vitals: Ht: 6'1.5" Wt: 287 Temp: 98.1 BP: 145/92 Pulse: 61 O2 97% on room air. Physical Exam: General: Alert, NAD.  Antalgic Gait  HEENT: EOMI, Good Neck Extension  Pulm: No increased work of breathing.  Clear B/L A/P w/o crackle or wheeze.  CV: RRR, No m/g/r appreciated  GI: soft, NT, ND Neuro: Neuro grossly intact b/l upper/lower ext.  Sensation intact distally Skin: No lesions in the area of chief complaint MSK/Surgical Site: Left knee w/o redness or effusion.  +Medial JLT. ROM 0-125.  5/5 strength in extension and flexion.  +EHL/FHL.  NVI.  Stable Lachman's and varus and valgus stress.   Imaging Review Medial meniscal tear with prominent degenerative medial chondral thinning by MRI. Medial joint space narrowing with preserved lateral space and good  medial opening on stress views on plain xray.   Assessment: OA left knee and tear left medial meniscus Principal Problem:   Primary osteoarthritis of left knee Active Problems:   Diabetes mellitus without complication (HCC)   OSA on CPAP   Plan: Plan for Procedure(s): UNICOMPARTMENTAL KNEE WITH MEDIAL MENISECTOMY  The patient history, physical exam, clinical judgement of the provider and imaging are consistent with end stage degenerative joint disease and unicompartmental joint  arthroplasty is deemed medically necessary. The treatment options including medical management, injection therapy, and arthroplasty were discussed at length. The risks and benefits of Procedure(s): UNICOMPARTMENTAL KNEE WITH MEDIAL MENISECTOMY were presented and reviewed.  The risks of nonoperative treatment, versus surgical intervention including but not limited to continued pain, aseptic loosening, stiffness, dislocation/subluxation, infection, bleeding, nerve injury, blood clots, cardiopulmonary complications, morbidity, mortality, among others were discussed. The patient verbalizes understanding and wishes to proceed with the plan.  Patient is being admitted for inpatient treatment for surgery, pain control, PT, OT, prophylactic antibiotics, VTE prophylaxis, progressive ambulation, ADL's and discharge planning.   Dental prophylaxis discussed and recommended for 2 years postoperatively.  The patient does meet the criteria for TXA which will be used perioperatively via IV.   ASA 325 mg will be used postoperatively for DVT prophylaxis in addition to SCDs, and early ambulation. The patient is planning to be discharged home with OPPT in care of his wife.  Prudencio Burly III, PA-C 05/24/2016 6:05 PM

## 2016-06-01 ENCOUNTER — Encounter (HOSPITAL_BASED_OUTPATIENT_CLINIC_OR_DEPARTMENT_OTHER): Payer: Self-pay | Admitting: *Deleted

## 2016-06-03 ENCOUNTER — Other Ambulatory Visit: Payer: Self-pay

## 2016-06-03 ENCOUNTER — Encounter (HOSPITAL_BASED_OUTPATIENT_CLINIC_OR_DEPARTMENT_OTHER)
Admission: RE | Admit: 2016-06-03 | Discharge: 2016-06-03 | Disposition: A | Discharge status: Home / Self Care | Payer: BC Managed Care – PPO | Source: Ambulatory Visit | Attending: Orthopedic Surgery | Admitting: Orthopedic Surgery

## 2016-06-03 DIAGNOSIS — R9431 Abnormal electrocardiogram [ECG] [EKG]: Secondary | ICD-10-CM | POA: Insufficient documentation

## 2016-06-03 DIAGNOSIS — Z01810 Encounter for preprocedural cardiovascular examination: Secondary | ICD-10-CM | POA: Insufficient documentation

## 2016-06-03 DIAGNOSIS — Z01812 Encounter for preprocedural laboratory examination: Secondary | ICD-10-CM | POA: Diagnosis not present

## 2016-06-03 LAB — BASIC METABOLIC PANEL
ANION GAP: 9 (ref 5–15)
BUN: 12 mg/dL (ref 6–20)
CHLORIDE: 104 mmol/L (ref 101–111)
CO2: 26 mmol/L (ref 22–32)
Calcium: 9.6 mg/dL (ref 8.9–10.3)
Creatinine, Ser: 1.24 mg/dL (ref 0.61–1.24)
Glucose, Bld: 141 mg/dL — ABNORMAL HIGH (ref 65–99)
POTASSIUM: 4.6 mmol/L (ref 3.5–5.1)
SODIUM: 139 mmol/L (ref 135–145)

## 2016-06-03 LAB — SURGICAL PCR SCREEN
MRSA, PCR: NEGATIVE
STAPHYLOCOCCUS AUREUS: POSITIVE — AB

## 2016-06-03 NOTE — Progress Notes (Signed)
Dr. Gifford Shave reviewed EKG - [facility] for surgery.

## 2016-06-09 ENCOUNTER — Encounter (HOSPITAL_BASED_OUTPATIENT_CLINIC_OR_DEPARTMENT_OTHER): Payer: Self-pay | Admitting: Anesthesiology

## 2016-06-10 ENCOUNTER — Ambulatory Visit (HOSPITAL_BASED_OUTPATIENT_CLINIC_OR_DEPARTMENT_OTHER)
Admission: RE | Admit: 2016-06-10 | Payer: BC Managed Care – PPO | Source: Ambulatory Visit | Admitting: Orthopedic Surgery

## 2016-06-10 SURGERY — ARTHROPLASTY, KNEE, UNICOMPARTMENTAL
Anesthesia: General | Laterality: Left

## 2017-03-02 IMAGING — CR DG CHEST 2V
2 series · 2 of 2 positions shown · non-contrast
Comparison: None.

CLINICAL DATA: Shortness of Breath

EXAM:
CHEST  2 VIEW

[w chest pa]
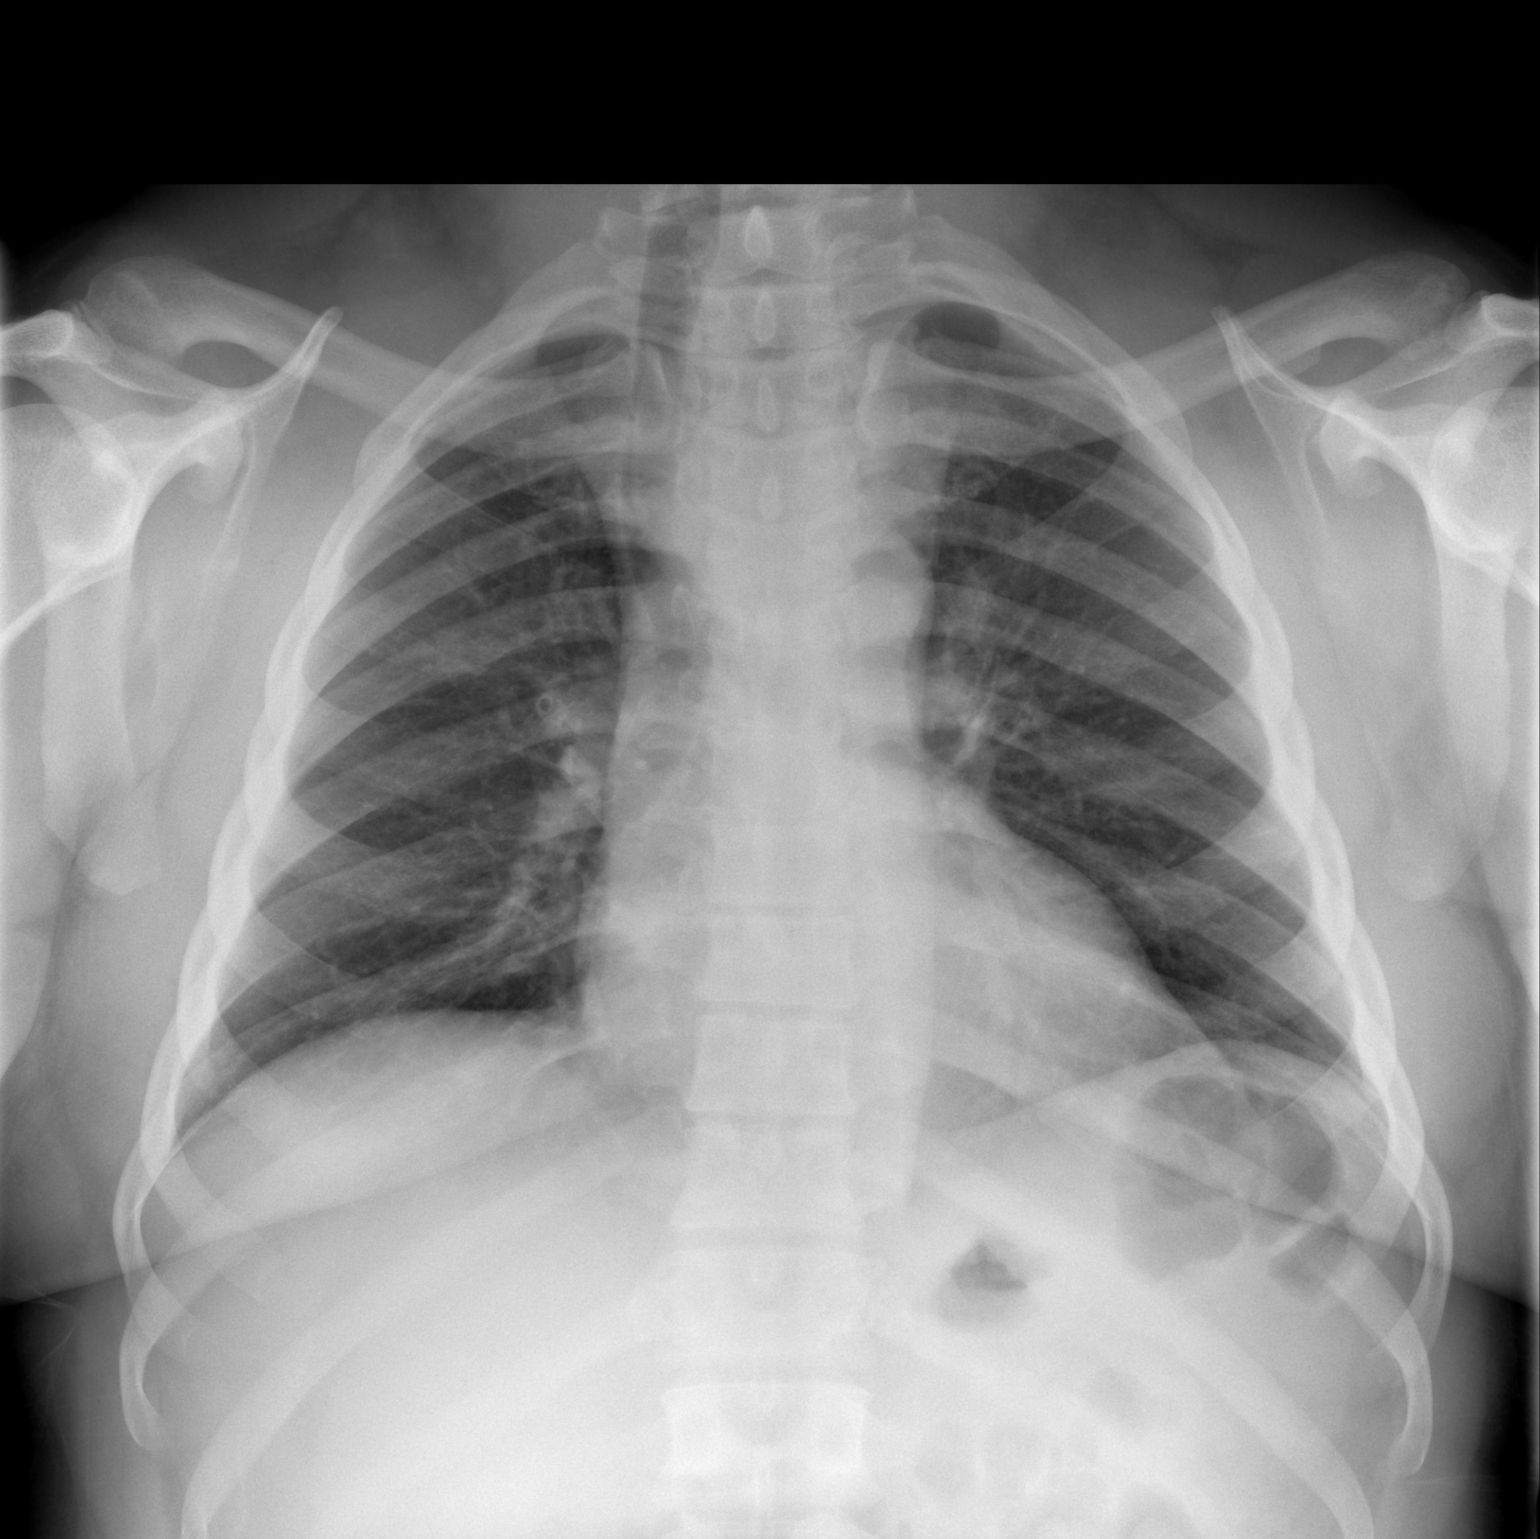

[w chest lat]
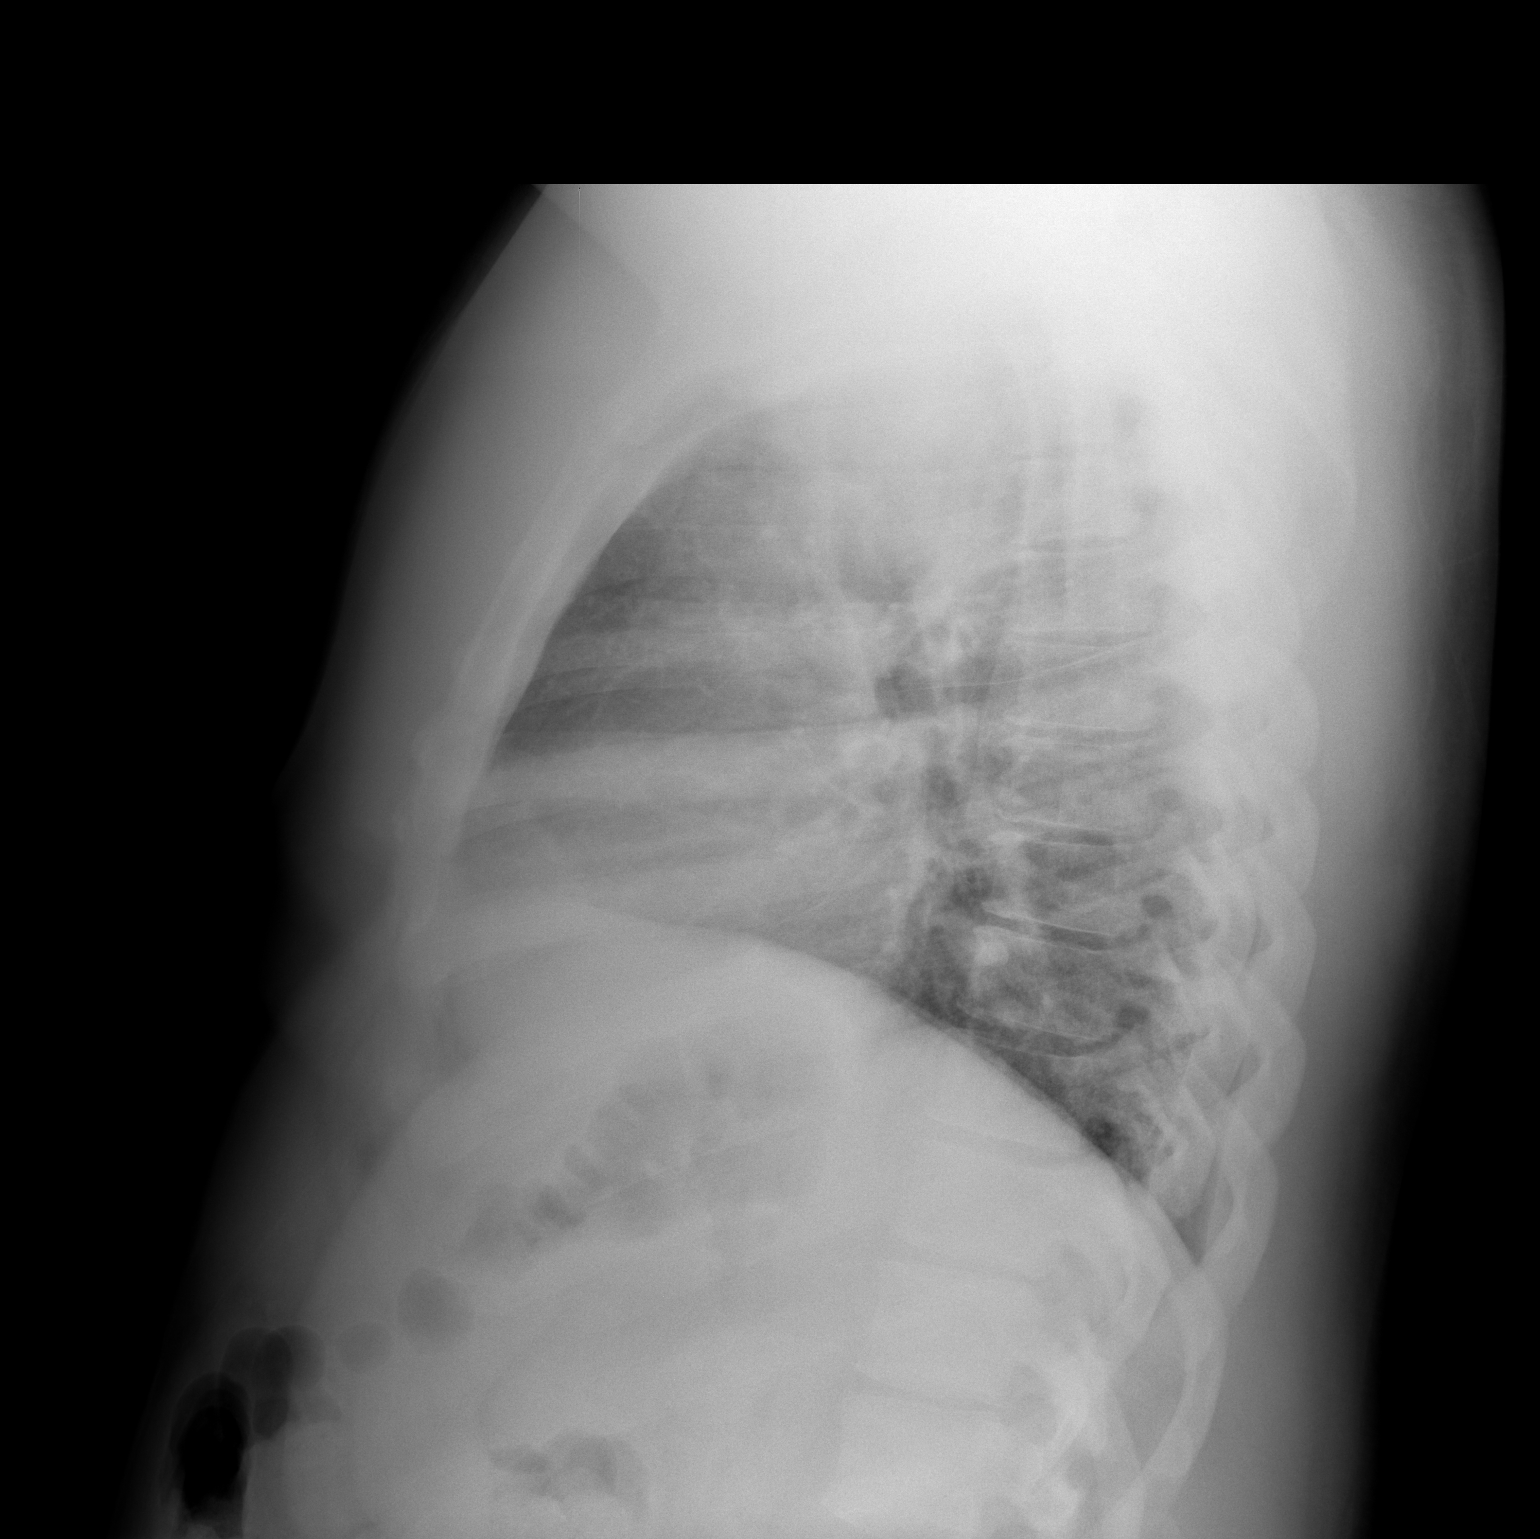

[2 of 2 positions shown; findings below may reference images not displayed]

FINDINGS: Cardiac size is unremarkable. No acute infiltrate or pulmonary
edema. There is right deviation of the trachea. Retrosternal goiter
or adenopathy cannot be excluded. Clinical correlation is necessary.
Further correlation with CT scan with IV contrast is recommended.
IMPRESSION: No acute infiltrate or pulmonary edema. There is right deviation of
the trachea. Retrosternal goiter or adenopathy cannot be excluded.
Clinical correlation is necessary. Further correlation with CT scan
with IV contrast is recommended.

## 2017-03-16 IMAGING — CT CT CHEST W/ CM
2 of 3 series · 14 of 31 positions shown, 16 images · IV contrast (75CC ISOVUE 300)
Comparison: Chest radiograph on 10/11/2014

CLINICAL DATA: Dyspnea on exertion. Mediastinal mass with tracheal
deviation on chest radiograph.

EXAM:
CT CHEST WITH CONTRAST
TECHNIQUE: Multidetector CT imaging of the chest was performed during
intravenous contrast administration.
CONTRAST:  75 mL Isovue 300

[Series 3: chest with · axial · 0.74mm/px · z∈[-229,-14]mm · 6 of 61 slices shown, 8 images]
[im 9/61  mediastinal]
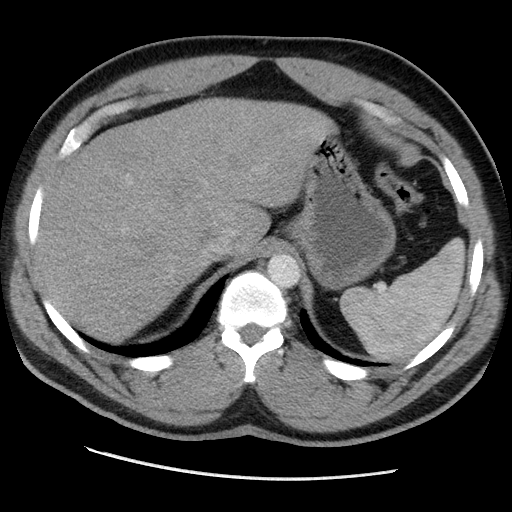
[im 9/61  lung]
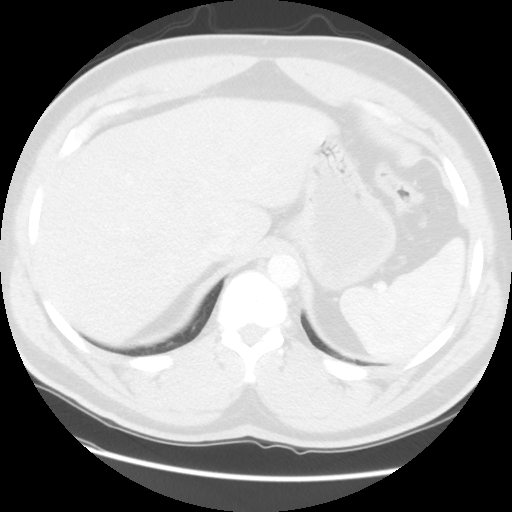
[im 18/61  lung]
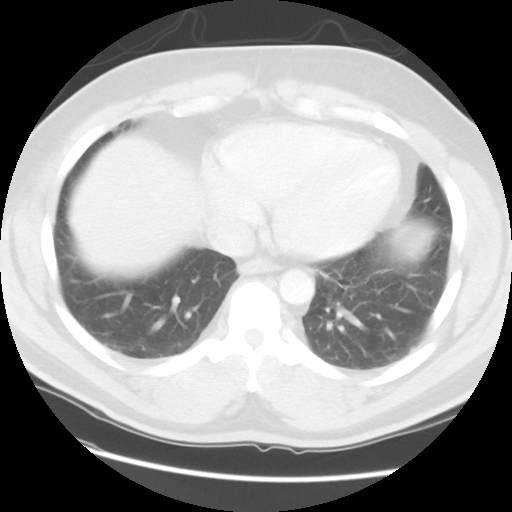
[im 26/61  lung]
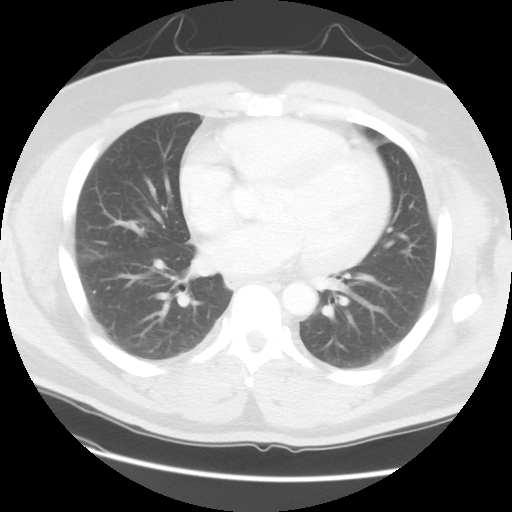
[im 35/61  lung]
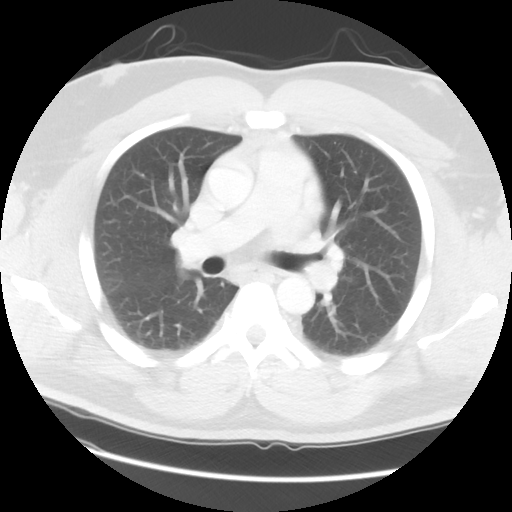
[im 43/61  mediastinal]
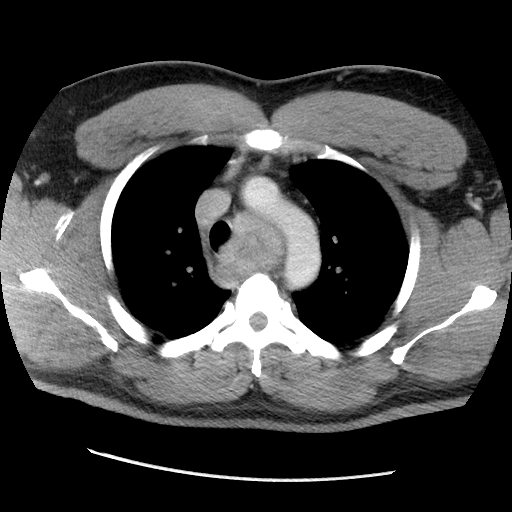
[im 43/61  lung]
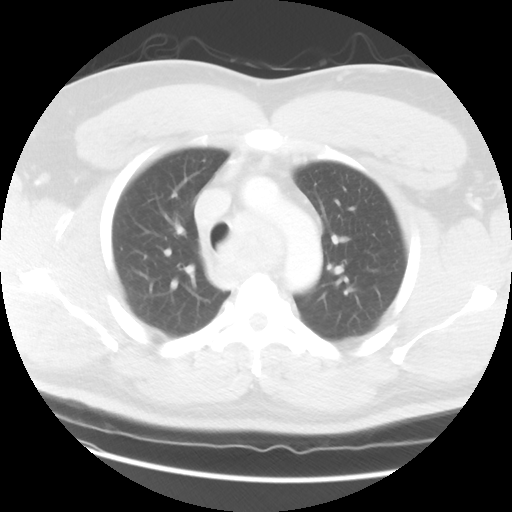
[im 52/61  lung]
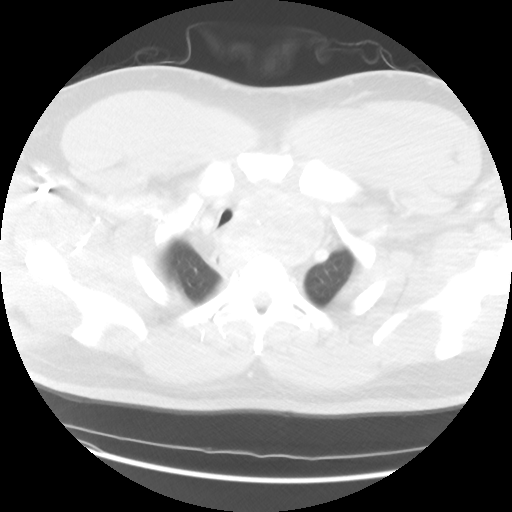

[Series 602: sagittal body · sagittal · 0.74mm/px · 8 of 152 slices shown]
[im 16/152  mediastinal]
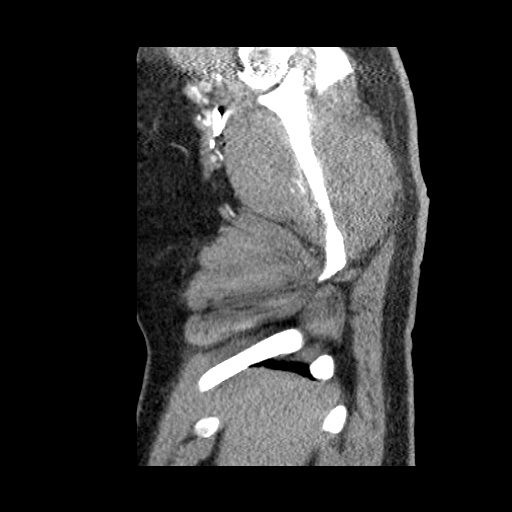
[im 32/152  mediastinal]
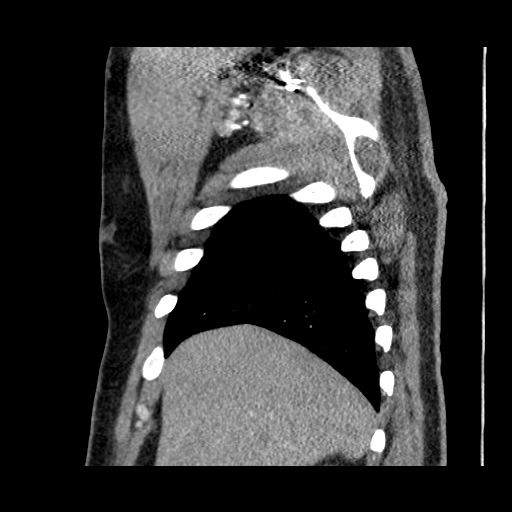
[im 48/152  mediastinal]
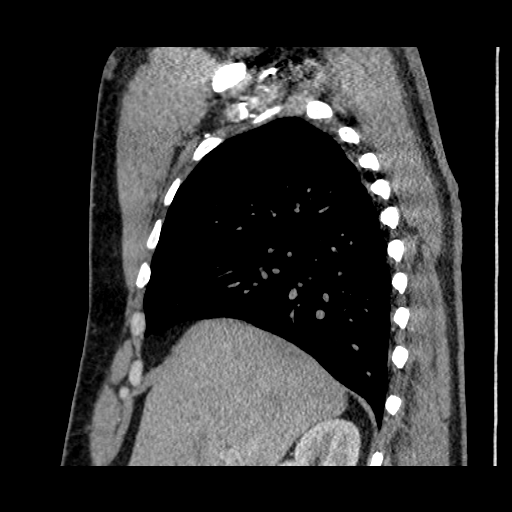
[im 72/152  mediastinal]
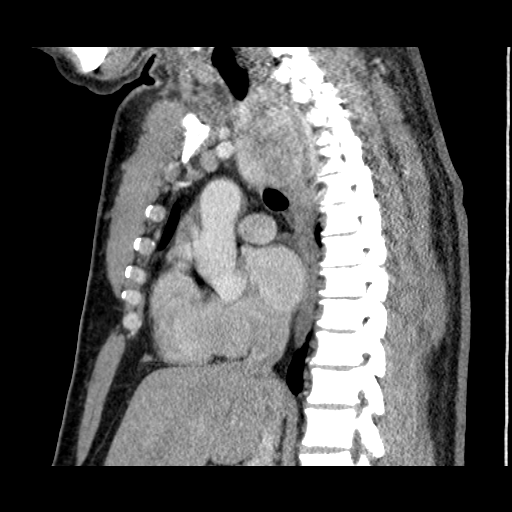
[im 80/152  mediastinal]
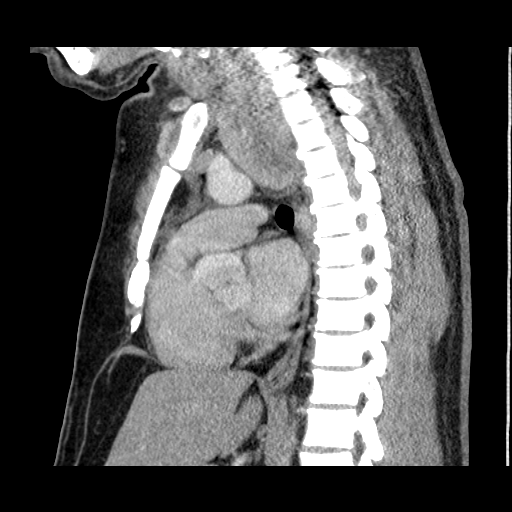
[im 104/152  mediastinal]
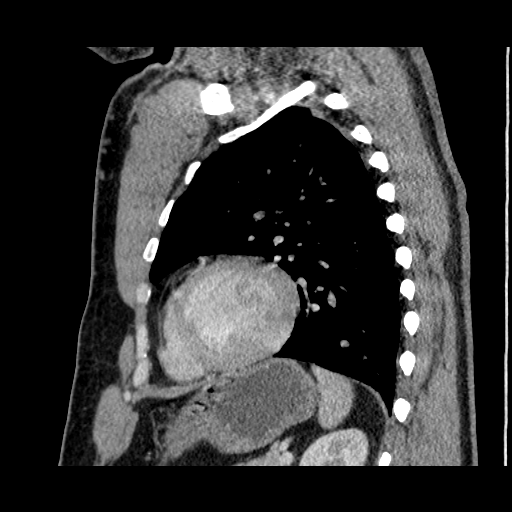
[im 120/152  mediastinal]
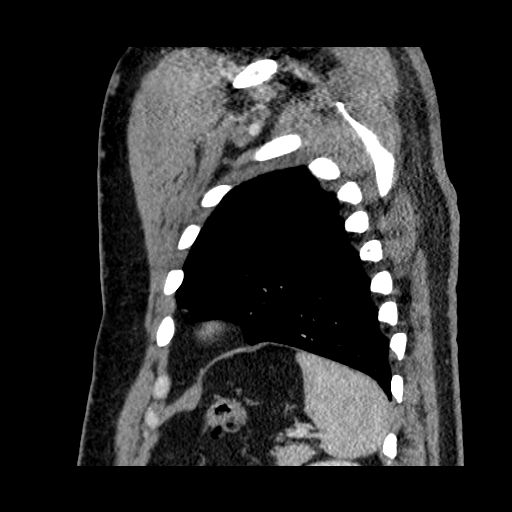
[im 136/152  mediastinal]
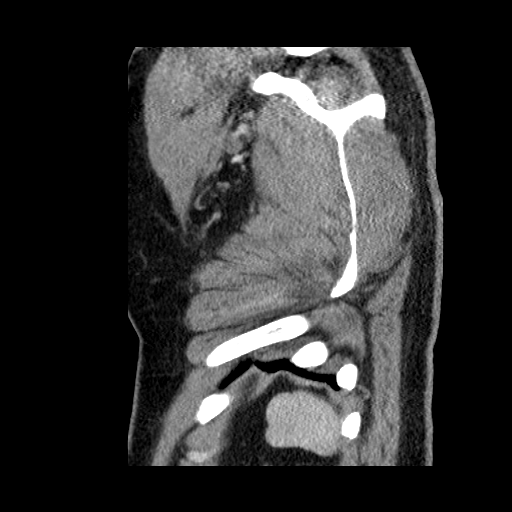

[14 of 31 positions shown; findings below may reference images not displayed]

FINDINGS: Mediastinum/Lymph Nodes: Substernal goiter is demonstrated causing
tracheal deviation to the right, which corresponds with the
mediastinal mass seen on chest radiograph. This appears to represent
a solitary large left thyroid lobe nodule measuring approximately 12
x 8 cm.

No lymphadenopathy identified within the thorax. Great vessels
another mediastinal structures are unremarkable in appearance.

Lungs/Pleura: No pulmonary infiltrate or mass identified. No
effusion present.

Musculoskeletal/Soft Tissues: No suspicious bone lesions or other
significant chest wall abnormality.

Upper Abdomen:  Unremarkable.
IMPRESSION: Substernal goiter causing tracheal deviation to the right,
corresponding to mediastinal mass seen on chest radiograph.

This goiter appears to be due to a solitary large left thyroid lobe
nodule measuring approximately 12 x 8 cm. Thyroid carcinoma cannot
be excluded. Thyroid ultrasound is recommended for further
evaluation. This follows ACR consensus guidelines: Managing
Incidental Thyroid Nodules Detected on Imaging: White Paper of [REDACTED]. [HOSPITAL] 6287;

## 2017-03-23 IMAGING — US US SOFT TISSUE HEAD/NECK
1 series · 14 of 25 positions shown · non-contrast
Comparison: CT 10/27/2014

CLINICAL DATA: Thyromegaly, substernal goiter on chest CT

EXAM:
THYROID ULTRASOUND
TECHNIQUE: Ultrasound examination of the thyroid gland and adjacent soft
tissues was performed.

[Series 1: us soft tissue head/neck · 0.19mm/px · 14 of 50 slices shown]
[im 1/50]
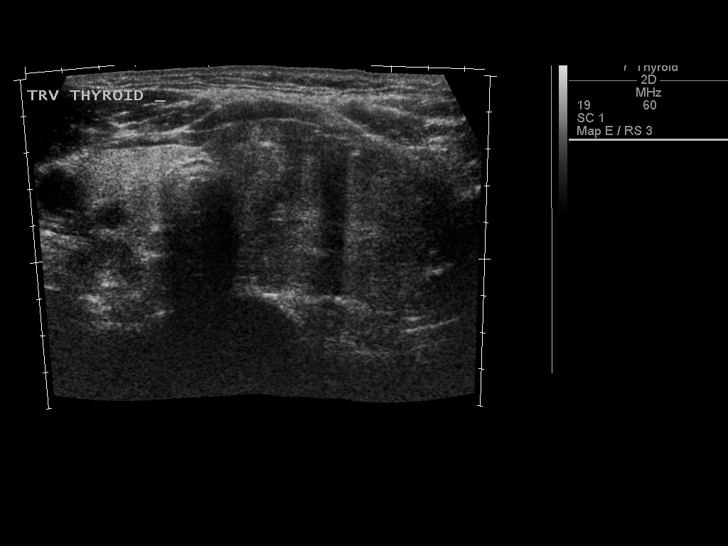
[im 5/50]
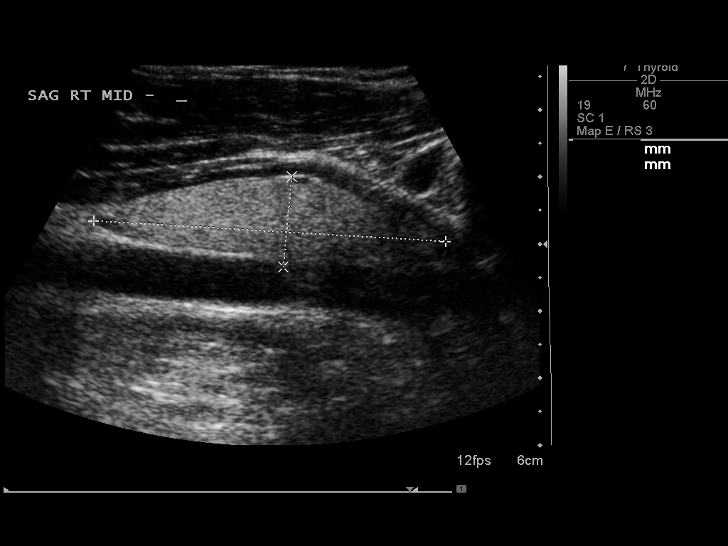
[im 9/50]
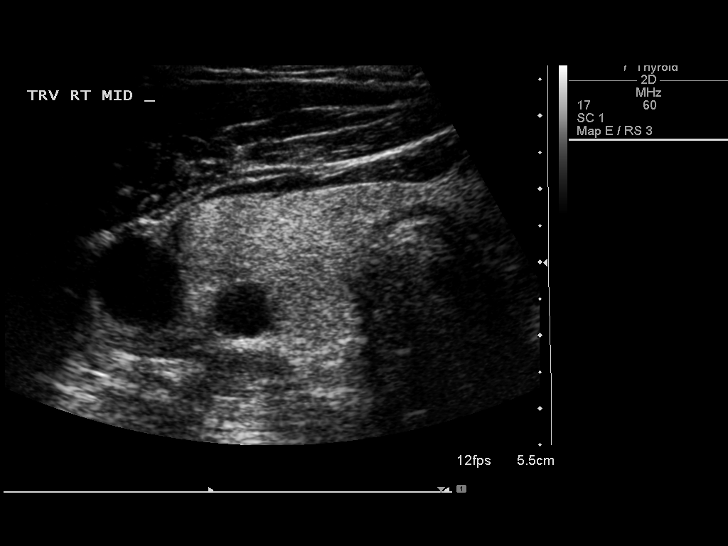
[im 13/50]
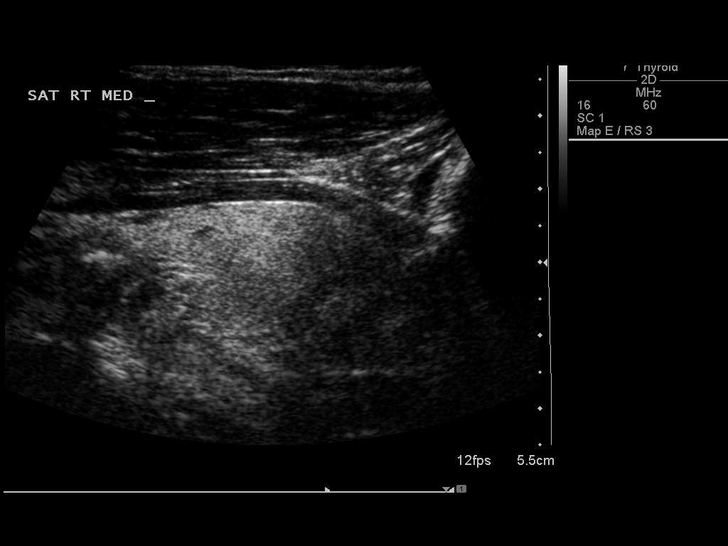
[im 17/50]
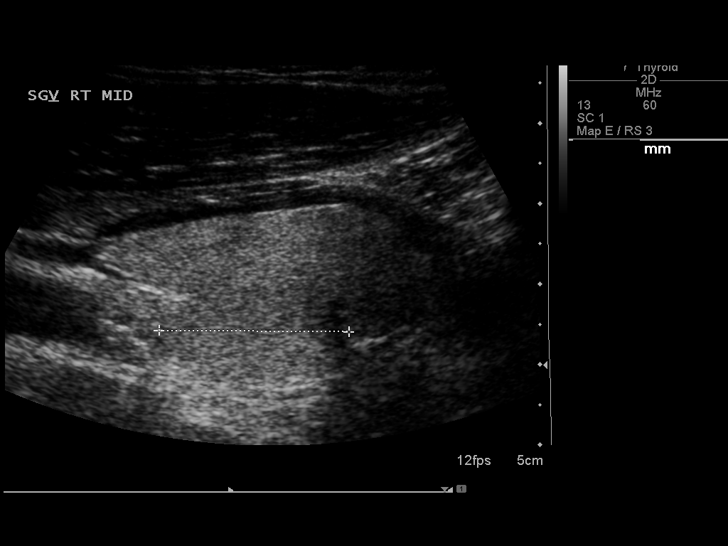
[im 19/50]
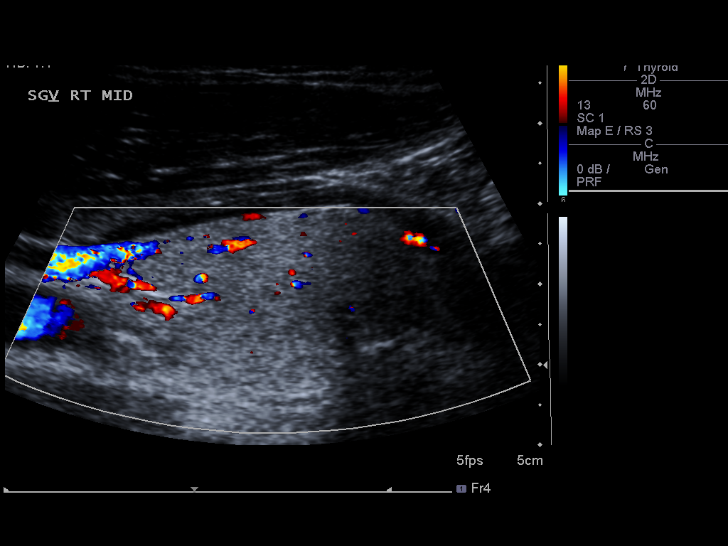
[im 23/50]
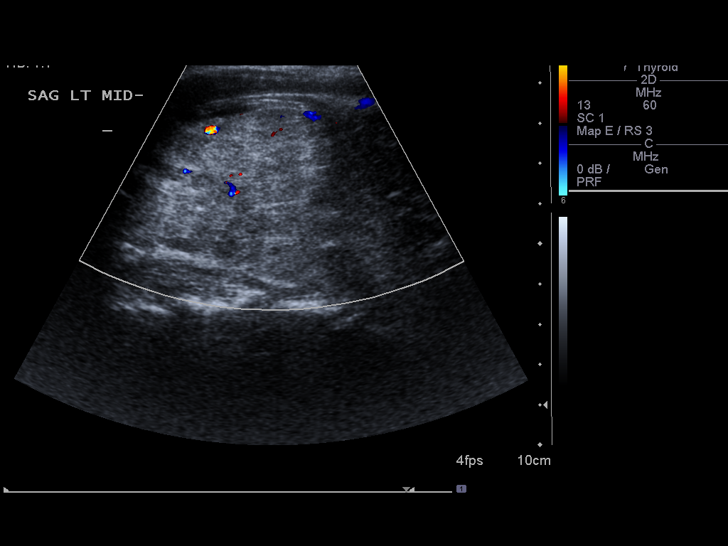
[im 27/50]
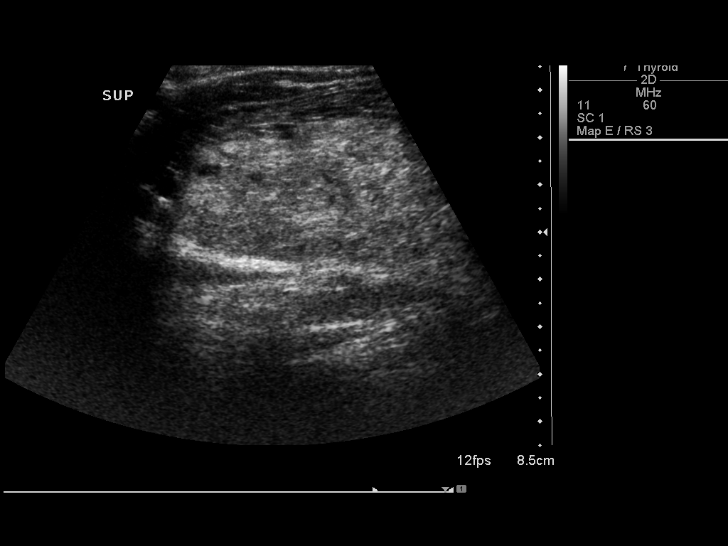
[im 31/50]
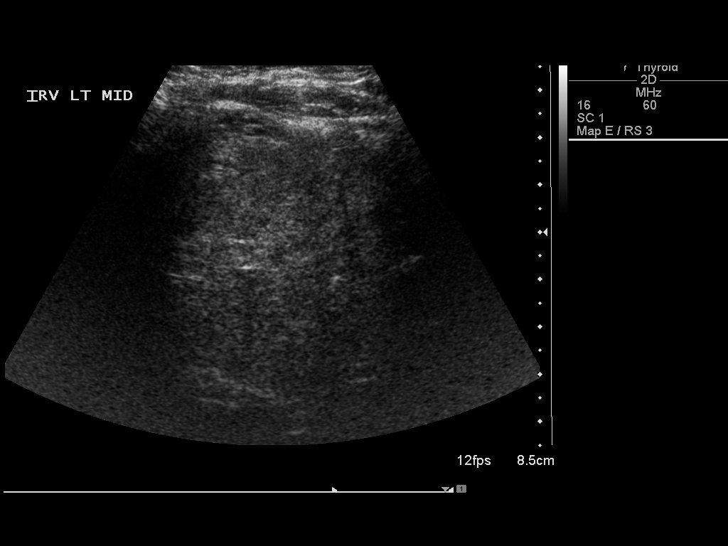
[im 33/50]
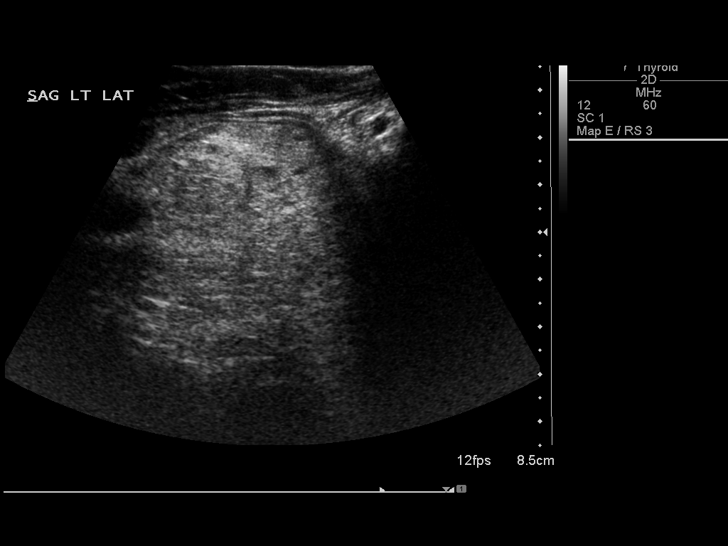
[im 37/50]
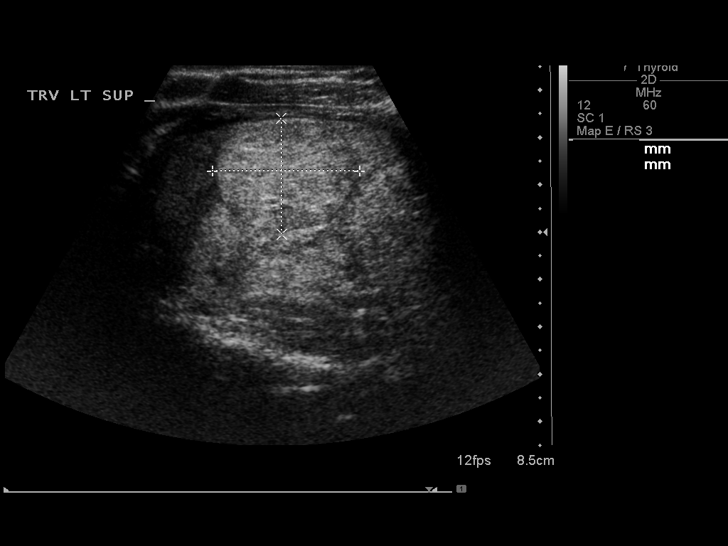
[im 41/50]
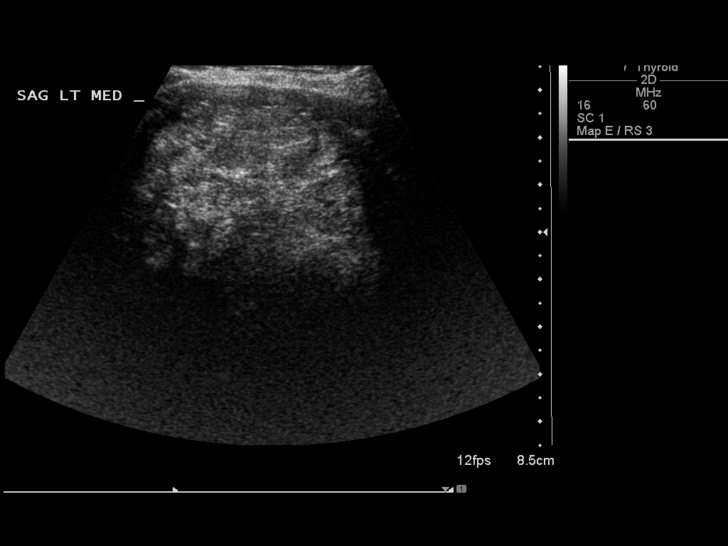
[im 45/50]
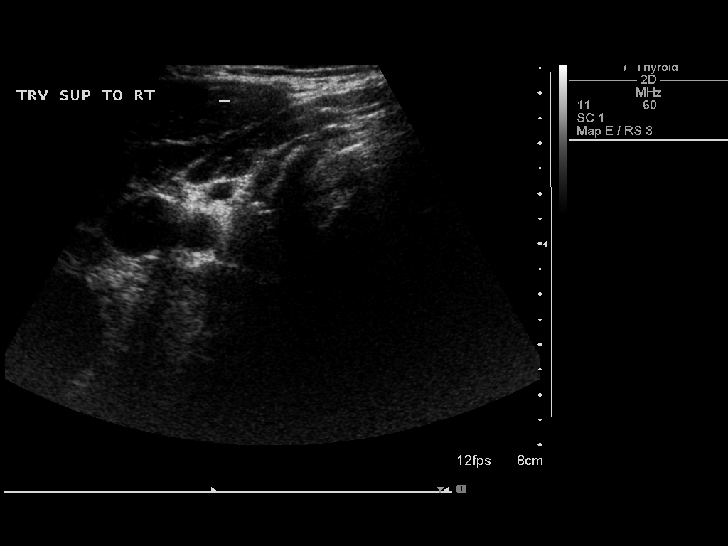
[im 50/50]
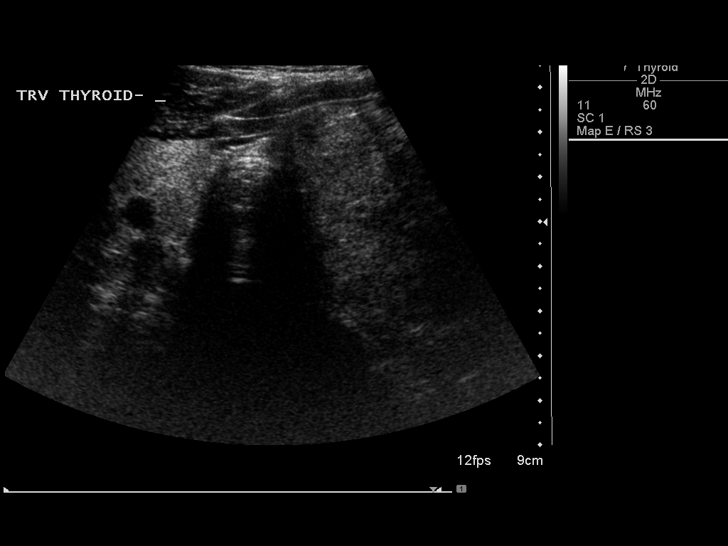

[14 of 25 positions shown; findings below may reference images not displayed]

FINDINGS: Right thyroid lobe

Measurements: 53 x 14 x 23 mm. Homogeneous background echotexture.
mid lobe.

Left thyroid lobe

Measurements: At least 89 x 53 x 69 mm. 32 x 22 x 27 mm solid
nodule, superior pole. Adjacent 31 x 25 x 35 mm nodule. The
extensive substernal extension seen on recent CT is not well
visualized.

Isthmus

Thickness: 6.4 mm.  No nodules visualized.

Lymphadenopathy

None visualized.
IMPRESSION: 1. Thyromegaly with large left masses, incomplete visualization of
sub sternal extension. Findings meet consensus criteria for biopsy.
Ultrasound-guided fine needle aspiration should be considered, as
per the consensus statement: Management of Thyroid Nodules Detected
at US: Society of Radiologists in Ultrasound Consensus Conference

## 2017-04-11 IMAGING — US US THYROID BIOPSY
1 series · 13 of 17 positions shown · non-contrast
Comparison: None.

CLINICAL DATA: 38-year-old male

EXAM:
ULTRASOUND GUIDED NEEDLE ASPIRATE BIOPSY OF THE THYROID GLAND

[Series 1: us thyroid biopsy · 0.13mm/px · 17 acquisitions, 13 frames shown]
[im 1/17]
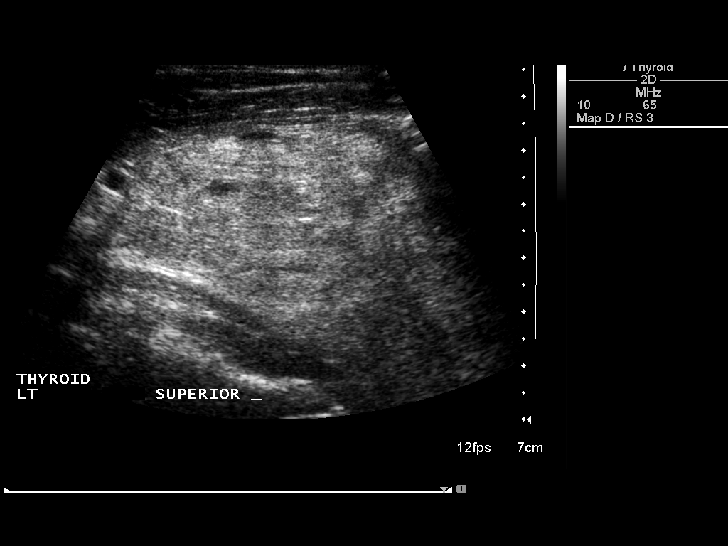
[im 2/17]
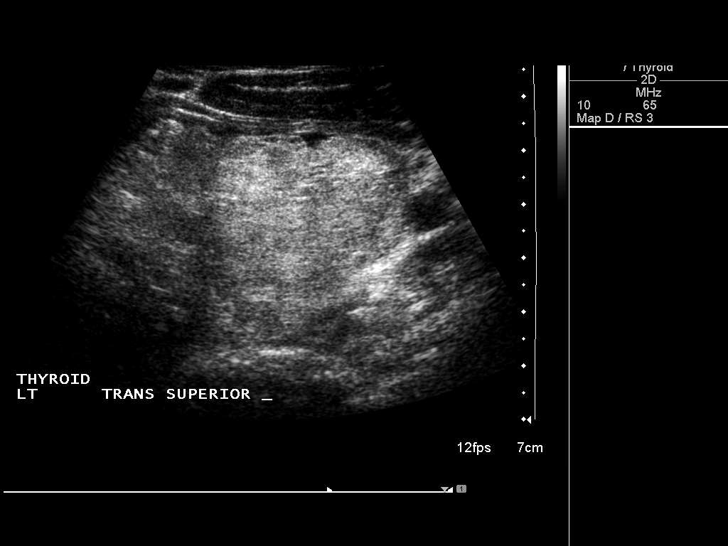
[im 4/17]
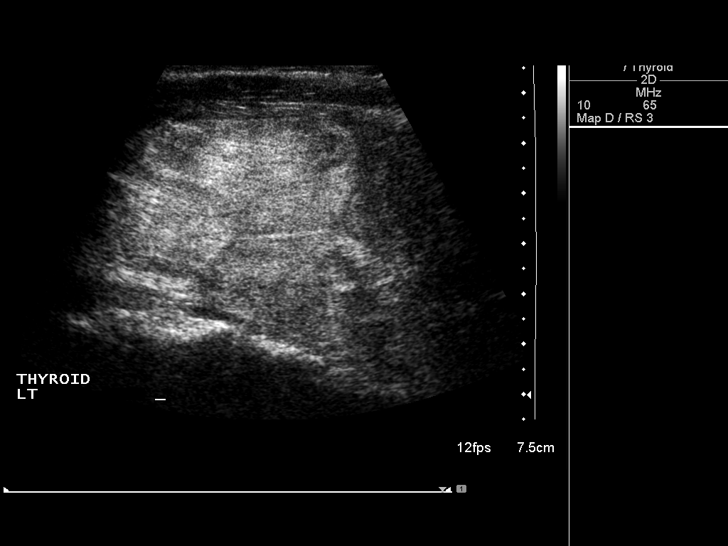
[im 5/17]
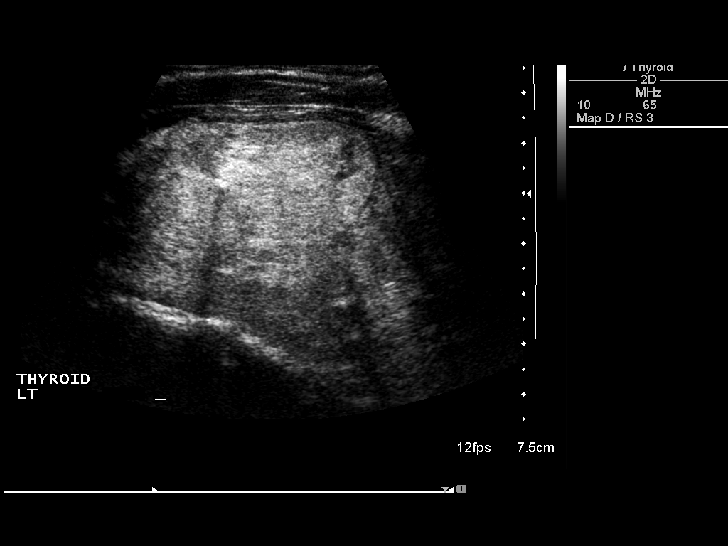
[im 6/17]
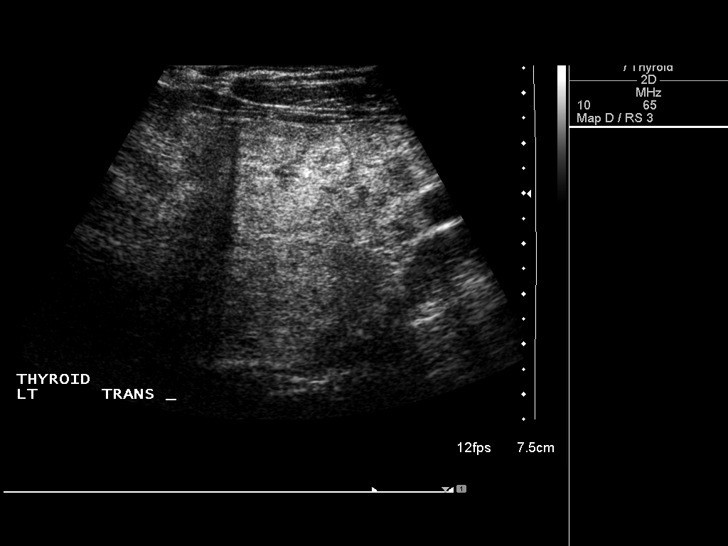
[im 8/17]
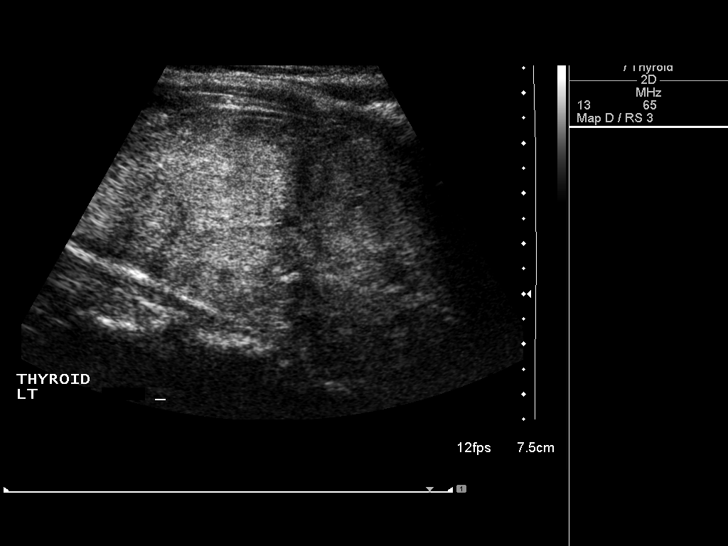
[im 9/17]
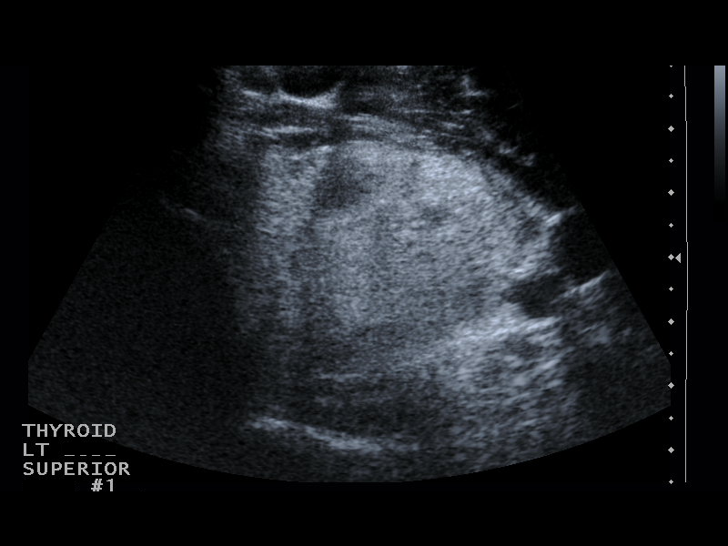
[im 10/17]
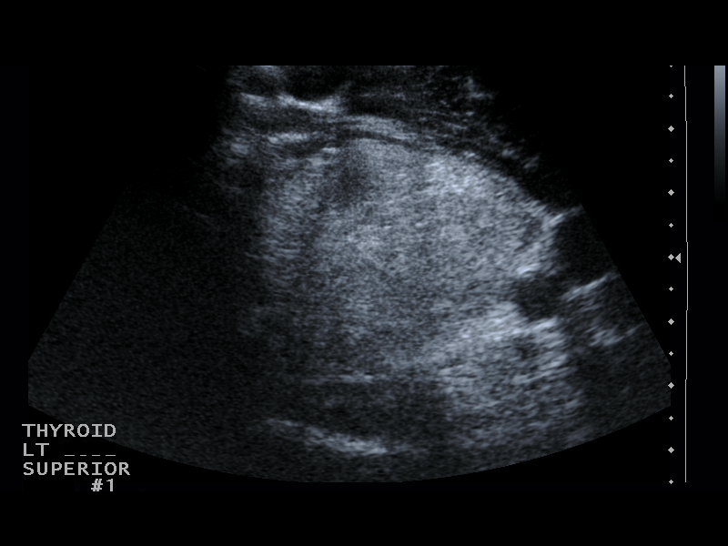
[im 12/17]
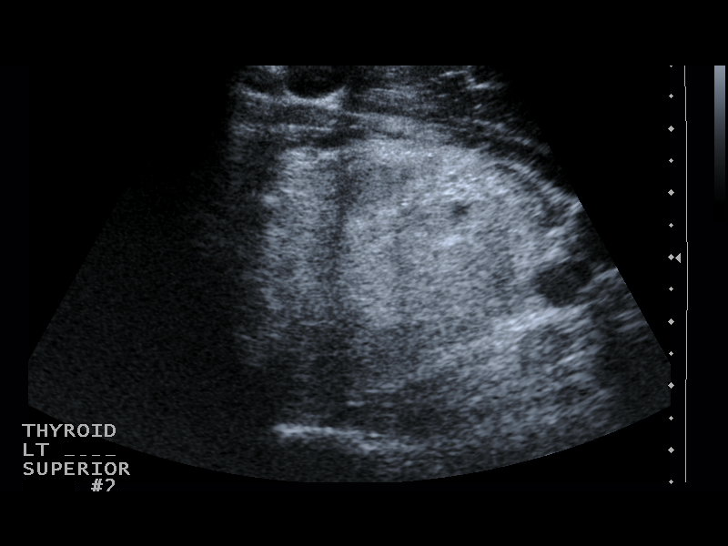
[im 13/17]
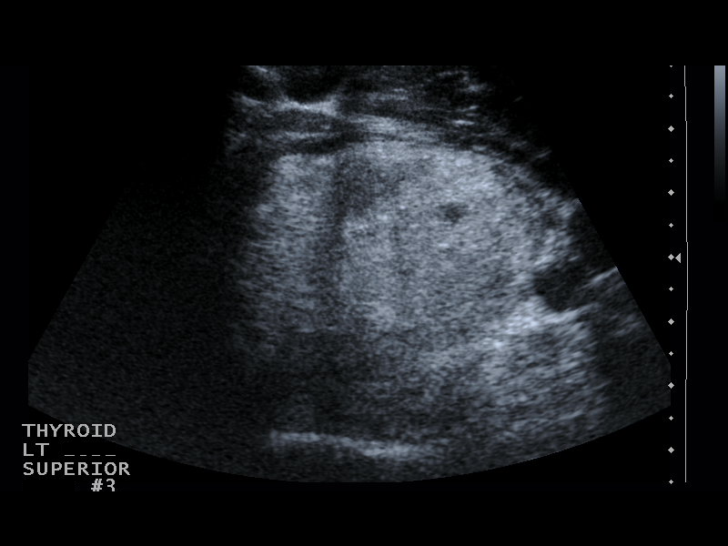
[im 14/17]
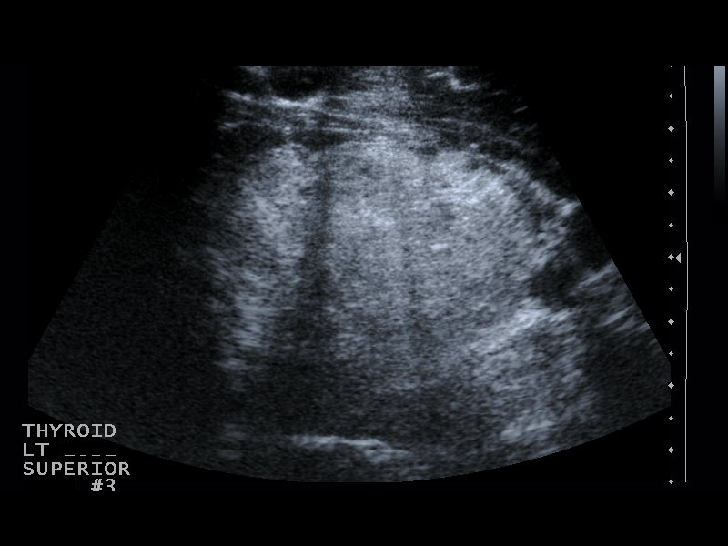
[im 16/17]
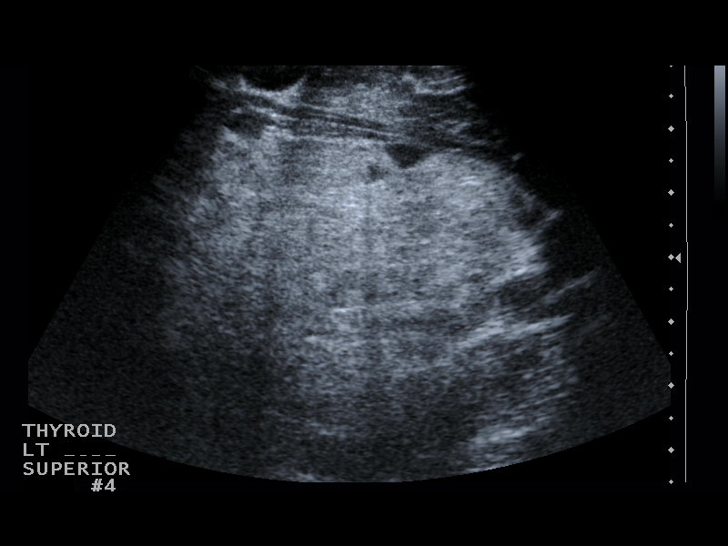
[im 17/17]
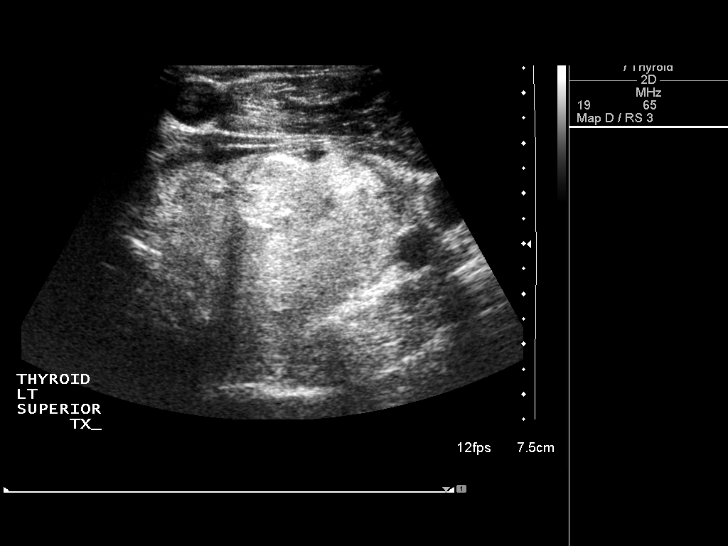

[13 of 17 positions shown; findings below may reference images not displayed]

PROCEDURE:
The procedure, risks, benefits, and alternatives were explained to
the patient. Questions regarding the procedure were encouraged and
answered. The patient understands and consents to the procedure.

Ultrasound survey was performed with images stored and sent to PACs.

The left neck was prepped with Betadine in a sterile fashion, and a
sterile drape was applied covering the operative field. A sterile
gown and sterile gloves were used for the procedure. Local
anesthesia was provided with 1% Lidocaine.

Ultrasound guidance was used to infiltrate the region with 1%
lidocaine for local anesthesia. Four separate 25 gauge fine needle
biopsy were then acquired of the left upper thyroid, lateral lesion
with ultrasound guidance. Images were stored.

Slide preparation was performed.

Final image was stored after biopsy.

Patient tolerated the procedure well and remained hemodynamically
stable throughout.

No complications were encountered and no significant blood loss was
encounter

COMPLICATIONS:
None.
FINDINGS: Ultrasound survey demonstrates heterogeneous left thyroid lobe. A
nodular region in the superior aspect of the left thyroid lobe
laterally was targeted for biopsy. This corresponds to the area
which was identified on ultrasound survey 11/01/2014.

Images during the case demonstrate safe placement of the needle tip
into this laterally position nodule.

Final image demonstrates no complicating features.
IMPRESSION: Status post ultrasound-guided biopsy of left upper lobe thyroid
nodule laterally. Tissue specimen sent to pathology for complete
histopathologic analysis.

Ultrasound survey of the left thyroid lobe demonstrates enlarged and
heterogeneous thyroid tissue, with no other focal nodule identified
on the current study which would be amenable for biopsy.
